# Patient Record
Sex: Male | Born: 1935 | Hispanic: No | Smoking: Former smoker
Health system: Southern US, Community
[De-identification: ages and names within clinical notes are randomized; demographics above are authoritative.]

---
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
## 2018-02-05 ENCOUNTER — Emergency Department (HOSPITAL_COMMUNITY): Payer: Medicare Other

## 2018-02-05 ENCOUNTER — Inpatient Hospital Stay (HOSPITAL_COMMUNITY)
Admission: EM | Admit: 2018-02-05 | Discharge: 2018-02-08 | DRG: 445 | Disposition: A | Discharge status: Home / Self Care | Payer: Medicare Other | Attending: Internal Medicine | Admitting: Internal Medicine

## 2018-02-05 ENCOUNTER — Other Ambulatory Visit: Payer: Self-pay

## 2018-02-05 ENCOUNTER — Inpatient Hospital Stay (HOSPITAL_COMMUNITY): Payer: Medicare Other

## 2018-02-05 ENCOUNTER — Encounter (HOSPITAL_COMMUNITY): Payer: Self-pay | Admitting: Emergency Medicine

## 2018-02-05 DIAGNOSIS — Z01818 Encounter for other preprocedural examination: Secondary | ICD-10-CM

## 2018-02-05 DIAGNOSIS — R748 Abnormal levels of other serum enzymes: Secondary | ICD-10-CM | POA: Diagnosis present

## 2018-02-05 DIAGNOSIS — E876 Hypokalemia: Secondary | ICD-10-CM | POA: Diagnosis present

## 2018-02-05 DIAGNOSIS — I440 Atrioventricular block, first degree: Secondary | ICD-10-CM | POA: Diagnosis present

## 2018-02-05 DIAGNOSIS — I493 Ventricular premature depolarization: Secondary | ICD-10-CM | POA: Diagnosis present

## 2018-02-05 DIAGNOSIS — R9431 Abnormal electrocardiogram [ECG] [EKG]: Secondary | ICD-10-CM | POA: Diagnosis present

## 2018-02-05 DIAGNOSIS — I5032 Chronic diastolic (congestive) heart failure: Secondary | ICD-10-CM | POA: Diagnosis present

## 2018-02-05 DIAGNOSIS — Z79899 Other long term (current) drug therapy: Secondary | ICD-10-CM | POA: Diagnosis not present

## 2018-02-05 DIAGNOSIS — K810 Acute cholecystitis: Secondary | ICD-10-CM | POA: Diagnosis present

## 2018-02-05 DIAGNOSIS — I351 Nonrheumatic aortic (valve) insufficiency: Secondary | ICD-10-CM | POA: Diagnosis not present

## 2018-02-05 DIAGNOSIS — K760 Fatty (change of) liver, not elsewhere classified: Secondary | ICD-10-CM | POA: Diagnosis present

## 2018-02-05 DIAGNOSIS — E871 Hypo-osmolality and hyponatremia: Secondary | ICD-10-CM | POA: Diagnosis present

## 2018-02-05 DIAGNOSIS — Z880 Allergy status to penicillin: Secondary | ICD-10-CM

## 2018-02-05 DIAGNOSIS — D72829 Elevated white blood cell count, unspecified: Secondary | ICD-10-CM | POA: Diagnosis not present

## 2018-02-05 LAB — COMPREHENSIVE METABOLIC PANEL
ALK PHOS: 63 U/L (ref 38–126)
ALT: 15 U/L — ABNORMAL LOW (ref 17–63)
ANION GAP: 11 (ref 5–15)
AST: 21 U/L (ref 15–41)
Albumin: 4.1 g/dL (ref 3.5–5.0)
BILIRUBIN TOTAL: 0.5 mg/dL (ref 0.3–1.2)
BUN: 17 mg/dL (ref 6–20)
CALCIUM: 8.7 mg/dL — AB (ref 8.9–10.3)
CO2: 21 mmol/L — ABNORMAL LOW (ref 22–32)
CREATININE: 1.04 mg/dL (ref 0.61–1.24)
Chloride: 103 mmol/L (ref 101–111)
GFR calc Af Amer: >60 mL/min (ref >60)
GFR calc non Af Amer: >60 mL/min (ref >60)
Glucose, Bld: 133 mg/dL — ABNORMAL HIGH (ref 65–99)
Potassium: 3.8 mmol/L (ref 3.5–5.1)
Sodium: 135 mmol/L (ref 135–145)
TOTAL PROTEIN: 6.8 g/dL (ref 6.5–8.1)

## 2018-02-05 LAB — TROPONIN I

## 2018-02-05 LAB — URINALYSIS, ROUTINE W REFLEX MICROSCOPIC
Bilirubin Urine: NEGATIVE
Glucose, UA: NEGATIVE mg/dL
Hgb urine dipstick: NEGATIVE
Ketones, ur: NEGATIVE mg/dL
LEUKOCYTES UA: NEGATIVE
Nitrite: NEGATIVE
PROTEIN: NEGATIVE mg/dL
Specific Gravity, Urine: 1.014 (ref 1.005–1.030)
pH: 7 (ref 5.0–8.0)

## 2018-02-05 LAB — CBC
HCT: 40.5 % (ref 39.0–52.0)
HEMOGLOBIN: 13.8 g/dL (ref 13.0–17.0)
MCH: 32.5 pg (ref 26.0–34.0)
MCHC: 34.1 g/dL (ref 30.0–36.0)
MCV: 95.5 fL (ref 78.0–100.0)
PLATELETS: 193 10*3/uL (ref 150–400)
RBC: 4.24 MIL/uL (ref 4.22–5.81)
RDW: 12.9 % (ref 11.5–15.5)
WBC: 8.7 10*3/uL (ref 4.0–10.5)

## 2018-02-05 LAB — LIPASE, BLOOD: Lipase: 55 U/L — ABNORMAL HIGH (ref 11–51)

## 2018-02-05 LAB — TSH: TSH: 0.667 u[IU]/mL (ref 0.350–4.500)

## 2018-02-05 MED ORDER — MORPHINE SULFATE (PF) 2 MG/ML IV SOLN: 2.0000 mg | INTRAVENOUS | Status: DC | PRN

## 2018-02-05 MED ORDER — TECHNETIUM TC 99M MEBROFENIN IV KIT
5.4500 | PACK | Freq: Once | INTRAVENOUS | Status: AC | PRN
  Administered 2018-02-05: 5.45 via INTRAVENOUS

## 2018-02-05 MED ORDER — IOPAMIDOL (ISOVUE-300) INJECTION 61%
100.0000 mL | Freq: Once | INTRAVENOUS | Status: AC | PRN
  Administered 2018-02-05: 100 mL via INTRAVENOUS

## 2018-02-05 MED ORDER — METRONIDAZOLE IN NACL 5-0.79 MG/ML-% IV SOLN
500.0000 mg | Freq: Three times a day (TID) | INTRAVENOUS | Status: DC
  Administered 2018-02-05 – 2018-02-07 (×5): 500 mg via INTRAVENOUS
  Filled 2018-02-05 (×6): qty 100

## 2018-02-05 MED ORDER — IOPAMIDOL (ISOVUE-300) INJECTION 61%
INTRAVENOUS | Status: AC
  Filled 2018-02-05: qty 100

## 2018-02-05 MED ORDER — MORPHINE SULFATE (PF) 2 MG/ML IV SOLN
2.0000 mg | Freq: Once | INTRAVENOUS | Status: AC
  Administered 2018-02-05: 2 mg via INTRAVENOUS
  Filled 2018-02-05: qty 1

## 2018-02-05 MED ORDER — IOPAMIDOL (ISOVUE-M 300) INJECTION 61%: 15.0000 mL | Freq: Once | INTRAMUSCULAR | Status: DC | PRN

## 2018-02-05 MED ORDER — ENOXAPARIN SODIUM 40 MG/0.4ML ~~LOC~~ SOLN
40.0000 mg | SUBCUTANEOUS | Status: DC
  Administered 2018-02-05 – 2018-02-07 (×3): 40 mg via SUBCUTANEOUS
  Filled 2018-02-05 (×3): qty 0.4

## 2018-02-05 MED ORDER — CIPROFLOXACIN IN D5W 400 MG/200ML IV SOLN
400.0000 mg | Freq: Two times a day (BID) | INTRAVENOUS | Status: DC
  Administered 2018-02-05 – 2018-02-07 (×4): 400 mg via INTRAVENOUS
  Filled 2018-02-05 (×4): qty 200

## 2018-02-05 MED ORDER — MORPHINE SULFATE (PF) 4 MG/ML IV SOLN
3.0000 mg | Freq: Once | INTRAVENOUS | Status: AC
  Administered 2018-02-05: 3 mg via INTRAVENOUS
  Filled 2018-02-05: qty 1

## 2018-02-05 MED ORDER — ONDANSETRON HCL 4 MG/2ML IJ SOLN
4.0000 mg | Freq: Once | INTRAMUSCULAR | Status: AC
Start: 2018-02-05 — End: 2018-02-05
  Administered 2018-02-05: 4 mg via INTRAVENOUS
  Filled 2018-02-05: qty 2

## 2018-02-05 MED ORDER — FAMOTIDINE 20 MG PO TABS
40.0000 mg | ORAL_TABLET | Freq: Once | ORAL | Status: AC
  Administered 2018-02-05: 40 mg via ORAL
  Filled 2018-02-05: qty 2

## 2018-02-05 MED ORDER — HYDROMORPHONE HCL 1 MG/ML IJ SOLN
0.5000 mg | Freq: Once | INTRAMUSCULAR | Status: AC
  Administered 2018-02-05: 0.5 mg via INTRAVENOUS
  Filled 2018-02-05: qty 1

## 2018-02-05 MED ORDER — SODIUM CHLORIDE 0.9 % IV SOLN
INTRAVENOUS | Status: DC
  Administered 2018-02-05: 08:00:00 via INTRAVENOUS

## 2018-02-05 MED ORDER — GI COCKTAIL ~~LOC~~
30.0000 mL | Freq: Once | ORAL | Status: AC
  Administered 2018-02-05: 30 mL via ORAL
  Filled 2018-02-05: qty 30

## 2018-02-05 MED ORDER — ONDANSETRON HCL 4 MG/2ML IJ SOLN: 4.0000 mg | Freq: Four times a day (QID) | INTRAMUSCULAR | Status: DC | PRN

## 2018-02-05 NOTE — Progress Notes (Signed)
Pharmacy consulted for Zosyn dosing for acute cholecystitis.  Patient has PCN allergy causing anaphylaxis.  Review of computer records revealed no history of antibiotics previously in our system.  Discussed with house coverage for hospitalist team.  Antibiotics changed to Cipro + Flagyl.    Netta Cedars, PharmD, BCPS 02/05/2018@8 :45 PM

## 2018-02-05 NOTE — ED Notes (Signed)
Radiology called requesting for an RN to come administer 3mg  morphine to pt in radiology for liver scan. Order placed, RN Rica Mote made aware.

## 2018-02-05 NOTE — ED Provider Notes (Signed)
[facility] DEPT Provider Note   CSN: [id number] Arrival date & time: 02/05/18  0530     History   Chief Complaint Chief Complaint  Patient presents with  . Abdominal Pain    HPI Jeremy Marks is a 84 y.o. male.  84 year old male presents with acute onset of epigastric abdominal pain that is nonradiating characterizes pressure.  States he had a heavy meal with wine last night and thinks that this could be the cause.  He has had nausea but no vomiting.  No fever or chills.  No urinary symptoms.  Denies any anorexia.  No chest pain or shortness of breath.  No exertional component.  No history of same.  Used Pepto-Bismol without relief.     History reviewed. No pertinent past medical history.  There are no active problems to display for this patient.   History reviewed. No pertinent surgical history.      Home Medications    Prior to Admission medications   Medication Sig Start Date End Date Taking? Authorizing Provider  acidophilus (RISAQUAD) CAPS capsule Take 1 capsule by mouth daily.   Yes [provider]  bisacodyl (DULCOLAX) 5 MG EC tablet Take 5 mg by mouth daily as needed for moderate constipation.   Yes [provider]  bismuth subsalicylate (PEPTO BISMOL) 262 MG/15ML suspension Take 30 mLs by mouth every 6 (six) hours as needed for indigestion.   Yes [provider]  dorzolamide-timolol (COSOPT) 22.3-6.8 MG/ML ophthalmic solution Place 1 drop into both eyes 2 (two) times daily.   Yes [provider]  latanoprost (XALATAN) 0.005 % ophthalmic solution Place 1 drop into both eyes at bedtime.   Yes [provider]    Family History No family history on file.  Social History Social History   Tobacco Use  . Smoking status: Not on file  Substance Use Topics  . Alcohol use: Not on file  . Drug use: Not on file     Allergies   Penicillins   Review of Systems Review of  Systems  All other systems reviewed and are negative.    Physical Exam Updated Vital Signs BP (!) 186/89 (BP Location: Left Arm)   Pulse (!) 55   Temp 97.6 F (36.4 C) (Oral)   Resp 15   SpO2 100%   Physical Exam  Constitutional: He is oriented to person, place, and time. He appears well-developed and well-nourished.  Non-toxic appearance. No distress.  HENT:  Head: Normocephalic and atraumatic.  Eyes: Pupils are equal, round, and reactive to light. Conjunctivae, EOM and lids are normal.  Neck: Normal range of motion. Neck supple. No tracheal deviation present. No thyroid mass present.  Cardiovascular: Normal rate, regular rhythm and normal heart sounds. Exam reveals no gallop.  No murmur heard. Pulmonary/Chest: Effort normal and breath sounds normal. No stridor. No respiratory distress. He has no decreased breath sounds. He has no wheezes. He has no rhonchi. He has no rales.  Abdominal: Soft. Normal appearance and bowel sounds are normal. He exhibits no distension. There is tenderness in the epigastric area. There is no rigidity, no rebound, no guarding and no CVA tenderness.    Musculoskeletal: Normal range of motion. He exhibits no edema or tenderness.  Neurological: He is alert and oriented to person, place, and time. He has normal strength. No cranial nerve deficit or sensory deficit. GCS eye subscore is 4. GCS verbal subscore is 5. GCS motor subscore is 6.  Skin: Skin is  warm and dry. No abrasion and no rash noted.  Psychiatric: He has a normal mood and affect. His speech is normal and behavior is normal.  Nursing note and vitals reviewed.    ED Treatments / Results  Labs (all labs ordered are listed, but only abnormal results are displayed) Labs Reviewed  LIPASE, BLOOD  COMPREHENSIVE METABOLIC PANEL  CBC  URINALYSIS, ROUTINE W REFLEX MICROSCOPIC    EKG EKG Interpretation  Date/Time:  Friday Feb 05 2018 06:22:51 EDT Ventricular Rate:  56 PR Interval:    QRS  Duration: 105 QT Interval:  435 QTC Calculation: 420 R Axis:   -14 Text Interpretation:  Sinus rhythm Ventricular premature complex Prolonged PR interval Anterior infarct, old No previous ECGs available Abnormal ekg Confirmed by Ripley Fraise [phone]) on 02/05/2018 6:29:08 AM   Radiology No results found.  Procedures Procedures (including critical care time)  Medications Ordered in ED Medications  0.9 %  sodium chloride infusion (has no administration in time range)  ondansetron (ZOFRAN) injection 4 mg (has no administration in time range)  morphine 2 MG/ML injection 2 mg (has no administration in time range)     Initial Impression / Assessment and Plan / ED Course  I have reviewed the triage vital signs and the nursing notes.  Pertinent labs & imaging results that were available during my care of the patient were reviewed by me and considered in my medical decision making (see chart for details).     Recent CT scan was concerning for possible cholelithiasis versus cholecystitis.  Had abdominal ultrasound was recommended a HIDA scan due to gallbladder sludge.  Patient had a scan is pending at this time will be signed out to Dr. Dayna Barker  Final Clinical Impressions(s) / ED Diagnoses   Final diagnoses:  None    ED Discharge Orders    None       Lacretia Leigh, MD 02/05/18 1554

## 2018-02-05 NOTE — ED Notes (Signed)
Patient informed about HIDA scan at 5-5:30.

## 2018-02-05 NOTE — ED Triage Notes (Signed)
Pt from home with c/o abdominal pain that woke him from his sleep. Pt rates pain 5/10. Pt states he took pepto bismol PTA. Pt states "I feel like I will burp and feel better".

## 2018-02-05 NOTE — Consult Note (Signed)
Reason for Consult:cholecystitis Referring Physician: Dr. Merrily Pew  Jeremy Marks is an 84 y.o. male.  HPI: This is a pleasant 84 year old gentleman who presented to the emergency department about 3 AM this morning with abdominal pain.  He described moderate epigastric abdominal pain with nausea but no vomiting.  The pain was described as sharp.  He initially had a CAT scan of the abdomen and pelvis which showed an area of hazy inflammation near the gallbladder.  This prompted an ultrasound of the abdomen that showed sludge in the gallbladder but no stones, wall thickening, or pericholecystic fluid.  He then underwent a HIDA scan showing nonvisualization of the gallbladder consistent with acute cholecystitis.  He reports that his abdominal pain is now better but still present in the right upper quadrant.  Surprisingly, he has very little medical history.  History reviewed. No pertinent past medical history.  History reviewed. No pertinent surgical history.  No family history on file.  Social History:  has no tobacco, alcohol, and drug history on file.  Allergies:  Allergies  Allergen Reactions  . Penicillins Anaphylaxis    Medications: I have reviewed the patient's current medications.  Results for orders placed or performed during the hospital encounter of 02/05/18 (from the past 48 hour(s))  Urinalysis, Routine w reflex microscopic     Status: None   Collection Time: 02/05/18  7:32 AM  Result Value Ref Range   Color, Urine YELLOW YELLOW   APPearance CLEAR CLEAR   Specific Gravity, Urine 1.014 1.005 - 1.030   pH 7.0 5.0 - 8.0   Glucose, UA NEGATIVE NEGATIVE mg/dL   Hgb urine dipstick NEGATIVE NEGATIVE   Bilirubin Urine NEGATIVE NEGATIVE   Ketones, ur NEGATIVE NEGATIVE mg/dL   Protein, ur NEGATIVE NEGATIVE mg/dL   Nitrite NEGATIVE NEGATIVE   Leukocytes, UA NEGATIVE NEGATIVE    Comment: Performed at [facility] [address] [place], [place]  [address]  Lipase, blood     Status: Abnormal   Collection Time: 02/05/18  7:41 AM  Result Value Ref Range   Lipase 55 (H) 11 - 51 U/L    Comment: Performed at [facility], [place] [address], [place], [place] [address]  Comprehensive metabolic panel     Status: Abnormal   Collection Time: 02/05/18  7:41 AM  Result Value Ref Range   Sodium 135 135 - 145 mmol/L   Potassium 3.8 3.5 - 5.1 mmol/L   Chloride 103 101 - 111 mmol/L   CO2 21 (L) 22 - 32 mmol/L   Glucose, Bld 133 (H) 65 - 99 mg/dL   BUN 17 6 - 20 mg/dL   Creatinine, Ser 1.04 0.61 - 1.24 mg/dL   Calcium 8.7 (L) 8.9 - 10.3 mg/dL   Total Protein 6.8 6.5 - 8.1 g/dL   Albumin 4.1 3.5 - 5.0 g/dL   AST 21 15 - 41 U/L   ALT 15 (L) 17 - 63 U/L   Alkaline Phosphatase 63 38 - 126 U/L   Total Bilirubin 0.5 0.3 - 1.2 mg/dL   GFR calc non Af Amer >60 >60 mL/min   GFR calc Af Amer >60 >60 mL/min    Comment: (NOTE) The eGFR has been calculated using the CKD EPI equation. This calculation has not been validated in all clinical situations. eGFR's persistently <60 mL/min signify possible Chronic Kidney Disease.    Anion gap 11 5 - 15    Comment: Performed at [facility], [place]  [address], [place], [place] [address]  CBC     Status: None   Collection Time: 02/05/18  7:41 AM  Result Value Ref Range   WBC 8.7 4.0 - 10.5 K/uL   RBC 4.24 4.22 - 5.81 MIL/uL   Hemoglobin 13.8 13.0 - 17.0 g/dL   HCT 40.5 39.0 - 52.0 %   MCV 95.5 78.0 - 100.0 fL   MCH 32.5 26.0 - 34.0 pg   MCHC 34.1 30.0 - 36.0 g/dL   RDW 12.9 11.5 - 15.5 %   Platelets 193 150 - 400 K/uL    Comment: Performed at [facility], [place] [address] [place], [place] [address]  Troponin I     Status: None   Collection Time: 02/05/18  7:41 AM  Result Value Ref Range   Troponin I <0.03 <0.03 ng/mL    Comment: Performed at [facility], [place] [address], [place], [place] [address]    Nm Hepatobiliary Liver  Func  Result Date: 02/05/2018 CLINICAL DATA:  Abdominal pain.  Evaluate for acute cholecystitis. EXAM: NUCLEAR MEDICINE HEPATOBILIARY IMAGING TECHNIQUE: Sequential images of the abdomen were obtained out to 60 minutes following intravenous administration of radiopharmaceutical. RADIOPHARMACEUTICALS:  5.45 mCi Tc-46m Choletec IV COMPARISON:  CT abdomen and pelvis and right upper quadrant ultrasound from same day. FINDINGS: Prompt uptake and biliary excretion of activity by the liver is seen. Gallbladder activity is not visualized. Biliary activity passes into small bowel, consistent with patent common bile duct. IMPRESSION: 1. Nonvisualization of the gallbladder, consistent with acute cholecystitis. Electronically Signed   By: WTitus DubinM.D.   On: 02/05/2018 17:14   Ct Abdomen Pelvis W Contrast  Result Date: 02/05/2018 CLINICAL DATA:  Upper to mid abdominal pain beginning this morning. Abdominal distention. EXAM: CT ABDOMEN AND PELVIS WITH CONTRAST TECHNIQUE: Multidetector CT imaging of the abdomen and pelvis was performed using the standard protocol following bolus administration of intravenous contrast. CONTRAST:  1046mISOVUE-300 IOPAMIDOL (ISOVUE-300) INJECTION 61% COMPARISON:  None. FINDINGS: Lower chest: No acute abnormality. Hepatobiliary: Liver is unremarkable. Gallbladder is distended. Although the wall does appear thickened, there is mild hazy inflammation in the fat adjacent to the lower gallbladder and gallbladder neck. No discrete stone is seen. No bile duct dilation. Pancreas: Unremarkable. No pancreatic ductal dilatation or surrounding inflammatory changes. Spleen: Normal in size without focal abnormality. Adrenals/Urinary Tract: Kidneys are normal in position. Symmetric renal enhancement and excretion. Small cyst arises from the lower pole the right kidney. No other masses. No stones. No hydronephrosis. Normal ureters. Bladder is unremarkable. Stomach/Bowel: Stomach and small bowel are  unremarkable. There are diverticula scattered throughout the colon. No diverticulitis or other colonic abnormality. Normal appendix visualized. Vascular/Lymphatic: Dense aortic atherosclerotic calcifications. No aneurysm. No enlarged lymph nodes. Reproductive: Marked enlargement of the prostate gland, measuring 9 x 6.1 x 6.4 cm. Other: No abdominal wall hernia or abnormality. No abdominopelvic ascites. Musculoskeletal: Mild compression fracture of L1, most likely chronic. No other fractures. No osteoblastic or osteolytic lesions. IMPRESSION: 1. Mild hazy inflammation adjacent to the gallbladder. Suspect early acute cholecystitis if this correlates clinically. Consider follow-up right upper quadrant ultrasound to assess for gallstones not visualized by CT. 2. No other evidence of an acute abnormality. 3. Marked prostatic hypertrophy. 4. Aortic atherosclerosis. 5. Scattered colonic diverticula without diverticulitis. Electronically Signed   By: DaLajean Manes.D.   On: 02/05/2018 09:27   UsKoreabdomen Limited  Result Date: 02/05/2018 CLINICAL DATA:  Right upper quadrant pain. EXAM: ULTRASOUND ABDOMEN  LIMITED RIGHT UPPER QUADRANT COMPARISON:  CT scan Feb 05, 2017 FINDINGS: Gallbladder: There is sludge in the gallbladder with no stones, wall thickening, pericholecystic fluid, or Murphy's sign. Common bile duct: Diameter: 3.6 mm Liver: Hepatic steatosis. No focal mass. Portal vein is patent on color Doppler imaging with normal direction of blood flow towards the liver. IMPRESSION: 1. There is sludge in the gallbladder with no wall thickening, pericholecystic fluid, stones, or Murphy's sign. A HIDA scan could further evaluate if clinical concern persists. 2. Hepatic steatosis. Electronically Signed   By: Dorise Bullion III M.D   On: 02/05/2018 10:51    Review of Systems  Constitutional: Negative for chills and fever.  Respiratory: Negative for cough and shortness of breath.   Cardiovascular: Negative for chest  pain.  Gastrointestinal: Positive for abdominal pain and nausea. Negative for vomiting.  Genitourinary: Negative for dysuria.  All other systems reviewed and are negative.  Blood pressure (!) 133/58, pulse 62, temperature 97.6 F (36.4 C), temperature source Oral, resp. rate 18, SpO2 98 %. Physical Exam  Constitutional: He is oriented to person, place, and time. He appears well-developed and well-nourished. No distress.  HENT:  Head: Normocephalic and atraumatic.  Right Ear: External ear normal.  Left Ear: External ear normal.  Nose: Nose normal.  Mouth/Throat: No oropharyngeal exudate.  Eyes: Pupils are equal, round, and reactive to light. Conjunctivae are normal. Right eye exhibits no discharge. Left eye exhibits no discharge. No scleral icterus.  Neck: Normal range of motion. No tracheal deviation present.  Cardiovascular: Normal rate, regular rhythm and normal heart sounds.  No murmur heard. Respiratory: Effort normal and breath sounds normal. No respiratory distress. He has no wheezes.  GI: Soft. He exhibits no distension. There is tenderness. There is guarding.  His abdomen is soft.  There is some tenderness with guarding in the right upper quadrant  Musculoskeletal: Normal range of motion. He exhibits no edema.  Neurological: He is alert and oriented to person, place, and time.  Skin: Skin is warm. He is not diaphoretic. No erythema.  Psychiatric: His behavior is normal. Judgment normal.    Assessment/Plan: Acute acalculous cholecystitis  I discussed the diagnosis with the patient.  Normally, we would recommend him proceeding with a laparoscopic cholecystectomy.  Given his age of 65, he does need a medical preoperative evaluation just to assure he is a candidate for general anesthesia from a cardiopulmonary standpoint.  I believe he can currently have liquids.  He will be started on IV antibiotics.  If he does quickly improve and become asymptomatic, we could consider continued  conservative management with oral antibiotics and hold on cholecystectomy.  We will make this decision based on his clinical picture.  Tacoma Merida A 02/05/2018, 7:01 PM

## 2018-02-05 NOTE — H&P (Addendum)
History and Physical  Charlton Boule Bolio UJW:[id number] DOB: 22-Jun-1923 DOA: 02/05/2018  Referring physician: Dr Dolly Rias  PCP: Orpah Melter, MD  Outpatient Specialists: [facility] Patient coming from: Home  Chief Complaint: Abdominal pain  HPI: Jeremy Marks is a 84 y.o. male with no significant past medical history who presented to [facility] ED with complaints of right upper quadrant and epigastric pain.  Onset early this morning associated with one loose stool.  Denies nausea or vomiting, fevers or chills.  States pain was severe, nonradiating.  He has never experienced any like that in the past.  No previous abdominal surgeries.  Reports occasional wine consumption.  No history of tobacco or illicit drug use.  No family history of sudden cardiac death.  Not on any prescribed medications and follows up with his PCP once a year.  ED Course: Upon presentation to the ED, vital signs are stable.  Afebrile.  Lab studies unremarkable.  EKG personally reviewed revealed first-degree AV block, sinus rhythm with rate of 56 and occasional PVCs.  CT abdomen and pelvis with contrast revealed suspicion for acute cholecystitis Confirmed by HIDA scan.  Abdominal ultrasound revealed hepatic steatosis.  General surgery consulted by ED physician.  Admitted for acute cholecystitis.  Review of Systems: Review of systems as noted in HPI.  All other systems reviewed and are negative.   History reviewed. No pertinent past medical history. History reviewed. No pertinent surgical history.  Social History:  has no tobacco, alcohol, and drug history on file. Denies use of tobacco or illicit drug.  Occasional wine use.  Allergies  Allergen Reactions  . Penicillins Anaphylaxis    No family history on file.  Mother with history of A. fib.  Deceased. No family history of a sudden cardiac death.  Prior to Admission medications   Medication Sig Start Date End Date Taking? Authorizing Provider    acidophilus (RISAQUAD) CAPS capsule Take 1 capsule by mouth daily.   Yes [provider]  bisacodyl (DULCOLAX) 5 MG EC tablet Take 5 mg by mouth daily as needed for moderate constipation.   Yes [provider]  bismuth subsalicylate (PEPTO BISMOL) 262 MG/15ML suspension Take 30 mLs by mouth every 6 (six) hours as needed for indigestion.   Yes [provider]  dorzolamide-timolol (COSOPT) 22.3-6.8 MG/ML ophthalmic solution Place 1 drop into both eyes 2 (two) times daily.   Yes [provider]  latanoprost (XALATAN) 0.005 % ophthalmic solution Place 1 drop into both eyes at bedtime.   Yes [provider]    Physical Exam: BP (!) 148/69 (BP Location: Right Arm)   Pulse (!) 59   Temp 97.7 F (36.5 C) (Oral)   Resp 17   SpO2 100%   . General: 84 y.o. year-old male well developed well nourished in no acute distress.  Alert and oriented x3. . Cardiovascular: Regular rate and rhythm with no rubs or gallops.  No thyromegaly or JVD noted.   Marland Kitchen Respiratory: Clear to auscultation with no wheezes or rales. Good inspiratory effort. . Abdomen: Soft nontender nondistended with normal bowel sounds x4 quadrants. . Musculoskeletal: No lower extremity edema. 2/4 pulses in all 4 extremities. . Skin: No ulcerative lesions noted or rashes, . Psychiatry: Mood is appropriate for condition and setting          Labs on Admission:  Basic Metabolic Panel: Recent Labs  Lab 02/05/18 0741  NA 135  K 3.8  CL 103  CO2 21*  GLUCOSE 133*  BUN  17  CREATININE 1.04  CALCIUM 8.7*   Liver Function Tests: Recent Labs  Lab 02/05/18 0741  AST 21  ALT 15*  ALKPHOS 63  BILITOT 0.5  PROT 6.8  ALBUMIN 4.1   Recent Labs  Lab 02/05/18 0741  LIPASE 55*   No results for input(s): AMMONIA in the last 168 hours. CBC: Recent Labs  Lab 02/05/18 0741  WBC 8.7  HGB 13.8  HCT 40.5  MCV 95.5  PLT 193   Cardiac Enzymes: Recent Labs  Lab 02/05/18 0741  TROPONINI  <0.03    BNP (last 3 results) No results for input(s): BNP in the last 8760 hours.  ProBNP (last 3 results) No results for input(s): PROBNP in the last 8760 hours.  CBG: No results for input(s): GLUCAP in the last 168 hours.  Radiological Exams on Admission: Nm Hepatobiliary Liver Func  Result Date: 02/05/2018 CLINICAL DATA:  Abdominal pain.  Evaluate for acute cholecystitis. EXAM: NUCLEAR MEDICINE HEPATOBILIARY IMAGING TECHNIQUE: Sequential images of the abdomen were obtained out to 60 minutes following intravenous administration of radiopharmaceutical. RADIOPHARMACEUTICALS:  5.45 mCi Tc-74m  Choletec IV COMPARISON:  CT abdomen and pelvis and right upper quadrant ultrasound from same day. FINDINGS: Prompt uptake and biliary excretion of activity by the liver is seen. Gallbladder activity is not visualized. Biliary activity passes into small bowel, consistent with patent common bile duct. IMPRESSION: 1. Nonvisualization of the gallbladder, consistent with acute cholecystitis. Electronically Signed   By: Titus Dubin M.D.   On: 02/05/2018 17:14   Ct Abdomen Pelvis W Contrast  Result Date: 02/05/2018 CLINICAL DATA:  Upper to mid abdominal pain beginning this morning. Abdominal distention. EXAM: CT ABDOMEN AND PELVIS WITH CONTRAST TECHNIQUE: Multidetector CT imaging of the abdomen and pelvis was performed using the standard protocol following bolus administration of intravenous contrast. CONTRAST:  189mL ISOVUE-300 IOPAMIDOL (ISOVUE-300) INJECTION 61% COMPARISON:  None. FINDINGS: Lower chest: No acute abnormality. Hepatobiliary: Liver is unremarkable. Gallbladder is distended. Although the wall does appear thickened, there is mild hazy inflammation in the fat adjacent to the lower gallbladder and gallbladder neck. No discrete stone is seen. No bile duct dilation. Pancreas: Unremarkable. No pancreatic ductal dilatation or surrounding inflammatory changes. Spleen: Normal in size without focal  abnormality. Adrenals/Urinary Tract: Kidneys are normal in position. Symmetric renal enhancement and excretion. Small cyst arises from the lower pole the right kidney. No other masses. No stones. No hydronephrosis. Normal ureters. Bladder is unremarkable. Stomach/Bowel: Stomach and small bowel are unremarkable. There are diverticula scattered throughout the colon. No diverticulitis or other colonic abnormality. Normal appendix visualized. Vascular/Lymphatic: Dense aortic atherosclerotic calcifications. No aneurysm. No enlarged lymph nodes. Reproductive: Marked enlargement of the prostate gland, measuring 9 x 6.1 x 6.4 cm. Other: No abdominal wall hernia or abnormality. No abdominopelvic ascites. Musculoskeletal: Mild compression fracture of L1, most likely chronic. No other fractures. No osteoblastic or osteolytic lesions. IMPRESSION: 1. Mild hazy inflammation adjacent to the gallbladder. Suspect early acute cholecystitis if this correlates clinically. Consider follow-up right upper quadrant ultrasound to assess for gallstones not visualized by CT. 2. No other evidence of an acute abnormality. 3. Marked prostatic hypertrophy. 4. Aortic atherosclerosis. 5. Scattered colonic diverticula without diverticulitis. Electronically Signed   By: Lajean Manes M.D.   On: 02/05/2018 09:27   US Abdomen Limited  Result Date: 02/05/2018 CLINICAL DATA:  Right upper quadrant pain. EXAM: ULTRASOUND ABDOMEN LIMITED RIGHT UPPER QUADRANT COMPARISON:  CT scan Feb 05, 2017 FINDINGS: Gallbladder: There is sludge in the gallbladder with no  stones, wall thickening, pericholecystic fluid, or Murphy's sign. Common bile duct: Diameter: 3.6 mm Liver: Hepatic steatosis. No focal mass. Portal vein is patent on color Doppler imaging with normal direction of blood flow towards the liver. IMPRESSION: 1. There is sludge in the gallbladder with no wall thickening, pericholecystic fluid, stones, or Murphy's sign. A HIDA scan could further evaluate  if clinical concern persists. 2. Hepatic steatosis. Electronically Signed   By: Dorise Bullion III M.D   On: 02/05/2018 10:51    EKG: Independently reviewed.  Personally reviewed EKG which revealed first-degree AV block, sinus rhythm with rate of 56 and occasional PVCs.    Assessment/Plan Present on Admission: . Acute cholecystitis  Active Problems:   Acute cholecystitis  Acute cholecystitis CT abdomen and pelvis done on presentation revealed suspicion for acute cholecystitis confirmed by HIDA scan Pain management in place with IV morphine 2 mg every 4 hours as needed for moderate to severe pain Can have clear liquid diet as tolerated per surgery N.p.o. after midnight IV Zofran for nausea as needed Gentle IV hydration 75 cc/hr NS IV zosyn empirically General surgery Dr. Ninfa Linden has been consulted.  Highly appreciated.  Abnormal EKG/first-degree AV block/occasional PVCs No prior cardiac history We will obtain a 2D echo to assess for any structural abnormality/aortic stenosis Monitor on telemetry  Hepatic steatosis LFTs are normal Repeat CMP in the morning  Elevated lipase We will obtain lipid panel Lipase 57 Repeat lipase level in the morning  Preop clearance We will obtain a chest x-ray Obtain lipid panel Obtain 2D echo to assess for any structural abnormalities/aortic stenosis.  Risks: Moderate due to age and abnormal EKG.  DVT prophylaxis: Subcu Lovenox daily  Code Status: Full code  Family Communication: None at bedside  Disposition Plan: Admitted as inpatient status  Consults called: General surgery called by ED physician.  Admission status: Inpatient    Kayleen Memos MD [facility] Pager [phone]  If 7PM-7AM, please contact night-coverage [email or web address] Password [facility]  02/05/2018, 7:38 PM

## 2018-02-05 NOTE — ED Notes (Signed)
ED TO INPATIENT HANDOFF REPORT  Name/Age/Gender Jeremy Marks 84 y.o. male  Code Status    Code Status Orders  (From admission, onward)        Start     Ordered   02/05/18 1848  Full code  Continuous     02/05/18 1847    Code Status History    This patient has a current code status but no historical code status.    Advance Directive Documentation     Most Recent Value  Type of Advance Directive  Healthcare Power of Attorney, Living will  Pre-existing out of facility DNR order (yellow form or pink MOST form)  -  "MOST" Form in Place?  -      Home/SNF/Other Home  Chief Complaint abd pain   Level of Care/Admitting Diagnosis ED Disposition    ED Disposition Condition [place]: [place] [[id number]]  Level of Care: Med-Surg [16]  Diagnosis: Acute cholecystitis [575.0.ICD-9-CM]  Admitting Physician: Kayleen Memos [[id number]]  Attending Physician: Kayleen Memos [[id number]]  Estimated length of stay: past midnight tomorrow  Certification:: I certify this patient will need inpatient services for at least 2 midnights  PT Class (Do Not Modify): Inpatient [101]  PT Acc Code (Do Not Modify): Private [1]       Medical History History reviewed. No pertinent past medical history.  Allergies Allergies  Allergen Reactions  . Penicillins Anaphylaxis    IV Location/Drains/Wounds Patient Lines/Drains/Airways Status   Active Line/Drains/Airways    Name:   Placement date:   Placement time:   Site:   Days:   Peripheral IV 02/05/18 Left Antecubital   02/05/18    0741    Antecubital   less than 1          Labs/Imaging Results for orders placed or performed during the hospital encounter of 02/05/18 (from the past 48 hour(s))  Urinalysis, Routine w reflex microscopic     Status: None   Collection Time: 02/05/18  7:32 AM  Result Value Ref Range   Color, Urine YELLOW YELLOW   APPearance CLEAR CLEAR   Specific Gravity,  Urine 1.014 1.005 - 1.030   pH 7.0 5.0 - 8.0   Glucose, UA NEGATIVE NEGATIVE mg/dL   Hgb urine dipstick NEGATIVE NEGATIVE   Bilirubin Urine NEGATIVE NEGATIVE   Ketones, ur NEGATIVE NEGATIVE mg/dL   Protein, ur NEGATIVE NEGATIVE mg/dL   Nitrite NEGATIVE NEGATIVE   Leukocytes, UA NEGATIVE NEGATIVE    Comment: Performed at [facility], [place] [address], [place], [place] [address]  Lipase, blood     Status: Abnormal   Collection Time: 02/05/18  7:41 AM  Result Value Ref Range   Lipase 55 (H) 11 - 51 U/L    Comment: Performed at [facility], [place] [address] [place], [place] [address]  Comprehensive metabolic panel     Status: Abnormal   Collection Time: 02/05/18  7:41 AM  Result Value Ref Range   Sodium 135 135 - 145 mmol/L   Potassium 3.8 3.5 - 5.1 mmol/L   Chloride 103 101 - 111 mmol/L   CO2 21 (L) 22 - 32 mmol/L   Glucose, Bld 133 (H) 65 - 99 mg/dL   BUN 17 6 - 20 mg/dL   Creatinine, Ser 1.04 0.61 - 1.24 mg/dL   Calcium 8.7 (L) 8.9 - 10.3 mg/dL   Total Protein 6.8 6.5 - 8.1 g/dL   Albumin 4.1 3.5 -  5.0 g/dL   AST 21 15 - 41 U/L   ALT 15 (L) 17 - 63 U/L   Alkaline Phosphatase 63 38 - 126 U/L   Total Bilirubin 0.5 0.3 - 1.2 mg/dL   GFR calc non Af Amer >60 >60 mL/min   GFR calc Af Amer >60 >60 mL/min    Comment: (NOTE) The eGFR has been calculated using the CKD EPI equation. This calculation has not been validated in all clinical situations. eGFR's persistently <60 mL/min signify possible Chronic Kidney Disease.    Anion gap 11 5 - 15    Comment: Performed at [facility], [place] [address], [place], [place] [address]  CBC     Status: None   Collection Time: 02/05/18  7:41 AM  Result Value Ref Range   WBC 8.7 4.0 - 10.5 K/uL   RBC 4.24 4.22 - 5.81 MIL/uL   Hemoglobin 13.8 13.0 - 17.0 g/dL   HCT 40.5 39.0 - 52.0 %   MCV 95.5 78.0 - 100.0 fL   MCH 32.5 26.0 - 34.0 pg   MCHC 34.1 30.0 - 36.0 g/dL   RDW 12.9 11.5 - 15.5  %   Platelets 193 150 - 400 K/uL    Comment: Performed at [facility], [place] [address], [place], [place] [address]  Troponin I     Status: None   Collection Time: 02/05/18  7:41 AM  Result Value Ref Range   Troponin I <0.03 <0.03 ng/mL    Comment: Performed at [facility], [place] [address], [place], [place] [address]   Nm Hepatobiliary Liver Func  Result Date: 02/05/2018 CLINICAL DATA:  Abdominal pain.  Evaluate for acute cholecystitis. EXAM: NUCLEAR MEDICINE HEPATOBILIARY IMAGING TECHNIQUE: Sequential images of the abdomen were obtained out to 60 minutes following intravenous administration of radiopharmaceutical. RADIOPHARMACEUTICALS:  5.45 mCi Tc-9m Choletec IV COMPARISON:  CT abdomen and pelvis and right upper quadrant ultrasound from same day. FINDINGS: Prompt uptake and biliary excretion of activity by the liver is seen. Gallbladder activity is not visualized. Biliary activity passes into small bowel, consistent with patent common bile duct. IMPRESSION: 1. Nonvisualization of the gallbladder, consistent with acute cholecystitis. Electronically Signed   By: WTitus DubinM.D.   On: 02/05/2018 17:14   Ct Abdomen Pelvis W Contrast  Result Date: 02/05/2018 CLINICAL DATA:  Upper to mid abdominal pain beginning this morning. Abdominal distention. EXAM: CT ABDOMEN AND PELVIS WITH CONTRAST TECHNIQUE: Multidetector CT imaging of the abdomen and pelvis was performed using the standard protocol following bolus administration of intravenous contrast. CONTRAST:  1063mISOVUE-300 IOPAMIDOL (ISOVUE-300) INJECTION 61% COMPARISON:  None. FINDINGS: Lower chest: No acute abnormality. Hepatobiliary: Liver is unremarkable. Gallbladder is distended. Although the wall does appear thickened, there is mild hazy inflammation in the fat adjacent to the lower gallbladder and gallbladder neck. No discrete stone is seen. No bile duct dilation. Pancreas: Unremarkable. No pancreatic  ductal dilatation or surrounding inflammatory changes. Spleen: Normal in size without focal abnormality. Adrenals/Urinary Tract: Kidneys are normal in position. Symmetric renal enhancement and excretion. Small cyst arises from the lower pole the right kidney. No other masses. No stones. No hydronephrosis. Normal ureters. Bladder is unremarkable. Stomach/Bowel: Stomach and small bowel are unremarkable. There are diverticula scattered throughout the colon. No diverticulitis or other colonic abnormality. Normal appendix visualized. Vascular/Lymphatic: Dense aortic atherosclerotic calcifications. No aneurysm. No enlarged lymph nodes. Reproductive: Marked enlargement of the prostate gland, measuring 9 x 6.1 x 6.4 cm. Other: No  abdominal wall hernia or abnormality. No abdominopelvic ascites. Musculoskeletal: Mild compression fracture of L1, most likely chronic. No other fractures. No osteoblastic or osteolytic lesions. IMPRESSION: 1. Mild hazy inflammation adjacent to the gallbladder. Suspect early acute cholecystitis if this correlates clinically. Consider follow-up right upper quadrant ultrasound to assess for gallstones not visualized by CT. 2. No other evidence of an acute abnormality. 3. Marked prostatic hypertrophy. 4. Aortic atherosclerosis. 5. Scattered colonic diverticula without diverticulitis. Electronically Signed   By: Lajean Manes M.D.   On: 02/05/2018 09:27   US Abdomen Limited  Result Date: 02/05/2018 CLINICAL DATA:  Right upper quadrant pain. EXAM: ULTRASOUND ABDOMEN LIMITED RIGHT UPPER QUADRANT COMPARISON:  CT scan Feb 05, 2017 FINDINGS: Gallbladder: There is sludge in the gallbladder with no stones, wall thickening, pericholecystic fluid, or Murphy's sign. Common bile duct: Diameter: 3.6 mm Liver: Hepatic steatosis. No focal mass. Portal vein is patent on color Doppler imaging with normal direction of blood flow towards the liver. IMPRESSION: 1. There is sludge in the gallbladder with no wall  thickening, pericholecystic fluid, stones, or Murphy's sign. A HIDA scan could further evaluate if clinical concern persists. 2. Hepatic steatosis. Electronically Signed   By: Dorise Bullion III M.D   On: 02/05/2018 10:51    Pending Labs Unresulted Labs (From admission, onward)   Start     Ordered   02/05/18 1847  Lipid panel  Add-on,   R     02/05/18 1846      Vitals/Pain Today's Vitals   02/05/18 0544 02/05/18 1142 02/05/18 1413 02/05/18 1834  BP:  120/84 (!) 131/51 (!) 133/58  Pulse:  89 61 62  Resp:  _0 Temp:      TempSrc:      SpO2:  93% 97% 98%  PainSc: 5        Isolation Precautions No active isolations  Medications Medications  0.9 %  sodium chloride infusion ( Intravenous New Bag/Given 02/05/18 0747)  iopamidol (ISOVUE-M) 61 % intrathecal injection 15 mL (has no administration in time range)  iopamidol (ISOVUE-300) 61 % injection (has no administration in time range)  morphine 2 MG/ML injection 2 mg (has no administration in time range)  ondansetron (ZOFRAN) injection 4 mg (4 mg Intravenous Given 02/05/18 0747)  morphine 2 MG/ML injection 2 mg (2 mg Intravenous Given 02/05/18 0747)  gi cocktail (Maalox,Lidocaine,Donnatal) (30 mLs Oral Given 02/05/18 0746)  famotidine (PEPCID) tablet 40 mg (40 mg Oral Given 02/05/18 0746)  iopamidol (ISOVUE-300) 61 % injection 100 mL (100 mLs Intravenous Contrast Given 02/05/18 0901)  HYDROmorphone (DILAUDID) injection 0.5 mg (0.5 mg Intravenous Given 02/05/18 1108)  technetium TC 20M mebrofenin (CHOLETEC) injection 3.82 millicurie (5.05 millicuries Intravenous Contrast Given 02/05/18 1505)  morphine 4 MG/ML injection 3 mg (3 mg Intravenous Given 02/05/18 1624)    Mobility walks with person assist

## 2018-02-05 NOTE — ED Provider Notes (Signed)
4:45 PM Assumed care from Dr. Zenia Resides, please see their note for full history, physical and decision making until this point. In brief this is a 84 y.o. year old male who presented to the ED tonight with Abdominal Pain     Suspect GB issue, pending HIDA.   HIDA positive for cholecystitis. Discussed with Dr. Rush Farmer with Gen Surg who requests medicine admission 2/2 age. hospitalist consulted.   Labs, studies and imaging reviewed by myself and considered in medical decision making if ordered. Imaging interpreted by radiology.  Labs Reviewed  LIPASE, BLOOD - Abnormal; Notable for the following components:      Result Value   Lipase 55 (*)    All other components within normal limits  COMPREHENSIVE METABOLIC PANEL - Abnormal; Notable for the following components:   CO2 21 (*)    Glucose, Bld 133 (*)    Calcium 8.7 (*)    ALT 15 (*)    All other components within normal limits  CBC  URINALYSIS, ROUTINE W REFLEX MICROSCOPIC  TROPONIN I    US Abdomen Limited  Final Result    CT Abdomen Pelvis W Contrast  Final Result    NM Hepatobiliary Liver Func    (Results Pending)    No follow-ups on file.    Jaleiyah Alas, Corene Cornea, MD 02/08/18 [phone]

## 2018-02-06 ENCOUNTER — Inpatient Hospital Stay (HOSPITAL_COMMUNITY): Payer: Medicare Other

## 2018-02-06 DIAGNOSIS — I440 Atrioventricular block, first degree: Secondary | ICD-10-CM

## 2018-02-06 DIAGNOSIS — I351 Nonrheumatic aortic (valve) insufficiency: Secondary | ICD-10-CM

## 2018-02-06 DIAGNOSIS — I5032 Chronic diastolic (congestive) heart failure: Secondary | ICD-10-CM

## 2018-02-06 DIAGNOSIS — Z01818 Encounter for other preprocedural examination: Secondary | ICD-10-CM

## 2018-02-06 LAB — COMPREHENSIVE METABOLIC PANEL
ALBUMIN: 3.4 g/dL — AB (ref 3.5–5.0)
ALK PHOS: 49 U/L (ref 38–126)
ALT: 15 U/L — ABNORMAL LOW (ref 17–63)
ANION GAP: 10 (ref 5–15)
AST: 25 U/L (ref 15–41)
BUN: 14 mg/dL (ref 6–20)
CALCIUM: 8.2 mg/dL — AB (ref 8.9–10.3)
CHLORIDE: 103 mmol/L (ref 101–111)
CO2: 21 mmol/L — ABNORMAL LOW (ref 22–32)
CREATININE: 0.96 mg/dL (ref 0.61–1.24)
GFR calc non Af Amer: >60 mL/min (ref >60)
GLUCOSE: 134 mg/dL — AB (ref 65–99)
Potassium: 3.7 mmol/L (ref 3.5–5.1)
Sodium: 134 mmol/L — ABNORMAL LOW (ref 135–145)
TOTAL PROTEIN: 6 g/dL — AB (ref 6.5–8.1)
Total Bilirubin: 1.1 mg/dL (ref 0.3–1.2)

## 2018-02-06 LAB — CBC
HCT: 38.9 % — ABNORMAL LOW (ref 39.0–52.0)
HEMOGLOBIN: 13.1 g/dL (ref 13.0–17.0)
MCH: 32.1 pg (ref 26.0–34.0)
MCHC: 33.7 g/dL (ref 30.0–36.0)
MCV: 95.3 fL (ref 78.0–100.0)
PLATELETS: 145 10*3/uL — AB (ref 150–400)
RBC: 4.08 MIL/uL — AB (ref 4.22–5.81)
RDW: 12.9 % (ref 11.5–15.5)
WBC: 16.5 10*3/uL — ABNORMAL HIGH (ref 4.0–10.5)

## 2018-02-06 LAB — LIPID PANEL
CHOL/HDL RATIO: 3.3 ratio
Cholesterol: 148 mg/dL (ref 0–200)
HDL: 45 mg/dL (ref >40)
LDL CALC: 90 mg/dL (ref 0–99)
TRIGLYCERIDES: 67 mg/dL (ref <150)
VLDL: 13 mg/dL (ref 0–40)

## 2018-02-06 LAB — ECHOCARDIOGRAM COMPLETE
Height: 69 in
WEIGHTICAEL: 2754.87 [oz_av]

## 2018-02-06 LAB — LIPASE, BLOOD: Lipase: 35 U/L (ref 11–51)

## 2018-02-06 MED ORDER — LIP MEDEX EX OINT
TOPICAL_OINTMENT | CUTANEOUS | Status: AC
  Administered 2018-02-06: 05:00:00
  Filled 2018-02-06: qty 7

## 2018-02-06 NOTE — Progress Notes (Signed)
  Echocardiogram 2D Echocardiogram has been performed.  Jeremy Marks M 02/06/2018, 9:34 AM

## 2018-02-06 NOTE — Progress Notes (Signed)
Subjective/Chief Complaint: abdominal pain Pt feels fine  Denies abdominal pain no nausea or vomiting    Objective: Vital signs in last 24 hours: Temp:  [97.7 F (36.5 C)-98.1 F (36.7 C)] 98.1 F (36.7 C) (05/18 0517) Pulse Rate:  [59-89] 79 (05/18 0517) Resp:  [6-18] 6 (05/18 0517) BP: (120-148)/(51-84) 128/59 (05/18 0517) SpO2:  [79 %-100 %] 79 % (05/18 0517) Weight:  [78.1 kg (172 lb 2.9 oz)] 78.1 kg (172 lb 2.9 oz) (05/17 2015) Last BM Date: 02/04/18  Intake/Output from previous day: 05/17 0701 - 05/18 0700 In: 1880.4 [I.V.:1880.4] Out: -  Intake/Output this shift: No intake/output data recorded.  GI: soft NT no RUQ tenderness negative Murphy's sign    HEENT  No jaundice       CV  RRR  PULM  CTA   Lab Results:  Recent Labs    02/05/18 0741 02/06/18 0420  WBC 8.7 16.5*  HGB 13.8 13.1  HCT 40.5 38.9*  PLT 193 145*   BMET Recent Labs    02/05/18 0741 02/06/18 0420  NA 135 134*  K 3.8 3.7  CL 103 103  CO2 21* 21*  GLUCOSE 133* 134*  BUN 17 14  CREATININE 1.04 0.96  CALCIUM 8.7* 8.2*   PT/INR No results for input(s): LABPROT, INR in the last 72 hours. ABG No results for input(s): PHART, HCO3 in the last 72 hours.  Invalid input(s): PCO2, PO2  Studies/Results: Nm Hepatobiliary Liver Func  Result Date: 02/05/2018 CLINICAL DATA:  Abdominal pain.  Evaluate for acute cholecystitis. EXAM: NUCLEAR MEDICINE HEPATOBILIARY IMAGING TECHNIQUE: Sequential images of the abdomen were obtained out to 60 minutes following intravenous administration of radiopharmaceutical. RADIOPHARMACEUTICALS:  5.45 mCi Tc-52m  Choletec IV COMPARISON:  CT abdomen and pelvis and right upper quadrant ultrasound from same day. FINDINGS: Prompt uptake and biliary excretion of activity by the liver is seen. Gallbladder activity is not visualized. Biliary activity passes into small bowel, consistent with patent common bile duct. IMPRESSION: 1. Nonvisualization of the gallbladder,  consistent with acute cholecystitis. Electronically Signed   By: Titus Dubin M.D.   On: 02/05/2018 17:14   Ct Abdomen Pelvis W Contrast  Result Date: 02/05/2018 CLINICAL DATA:  Upper to mid abdominal pain beginning this morning. Abdominal distention. EXAM: CT ABDOMEN AND PELVIS WITH CONTRAST TECHNIQUE: Multidetector CT imaging of the abdomen and pelvis was performed using the standard protocol following bolus administration of intravenous contrast. CONTRAST:  138mL ISOVUE-300 IOPAMIDOL (ISOVUE-300) INJECTION 61% COMPARISON:  None. FINDINGS: Lower chest: No acute abnormality. Hepatobiliary: Liver is unremarkable. Gallbladder is distended. Although the wall does appear thickened, there is mild hazy inflammation in the fat adjacent to the lower gallbladder and gallbladder neck. No discrete stone is seen. No bile duct dilation. Pancreas: Unremarkable. No pancreatic ductal dilatation or surrounding inflammatory changes. Spleen: Normal in size without focal abnormality. Adrenals/Urinary Tract: Kidneys are normal in position. Symmetric renal enhancement and excretion. Small cyst arises from the lower pole the right kidney. No other masses. No stones. No hydronephrosis. Normal ureters. Bladder is unremarkable. Stomach/Bowel: Stomach and small bowel are unremarkable. There are diverticula scattered throughout the colon. No diverticulitis or other colonic abnormality. Normal appendix visualized. Vascular/Lymphatic: Dense aortic atherosclerotic calcifications. No aneurysm. No enlarged lymph nodes. Reproductive: Marked enlargement of the prostate gland, measuring 9 x 6.1 x 6.4 cm. Other: No abdominal wall hernia or abnormality. No abdominopelvic ascites. Musculoskeletal: Mild compression fracture of L1, most likely chronic. No other fractures. No osteoblastic or osteolytic lesions. IMPRESSION: 1.  Mild hazy inflammation adjacent to the gallbladder. Suspect early acute cholecystitis if this correlates clinically. Consider  follow-up right upper quadrant ultrasound to assess for gallstones not visualized by CT. 2. No other evidence of an acute abnormality. 3. Marked prostatic hypertrophy. 4. Aortic atherosclerosis. 5. Scattered colonic diverticula without diverticulitis. Electronically Signed   By: Lajean Manes M.D.   On: 02/05/2018 09:27   US Abdomen Limited  Result Date: 02/05/2018 CLINICAL DATA:  Right upper quadrant pain. EXAM: ULTRASOUND ABDOMEN LIMITED RIGHT UPPER QUADRANT COMPARISON:  CT scan Feb 05, 2017 FINDINGS: Gallbladder: There is sludge in the gallbladder with no stones, wall thickening, pericholecystic fluid, or Murphy's sign. Common bile duct: Diameter: 3.6 mm Liver: Hepatic steatosis. No focal mass. Portal vein is patent on color Doppler imaging with normal direction of blood flow towards the liver. IMPRESSION: 1. There is sludge in the gallbladder with no wall thickening, pericholecystic fluid, stones, or Murphy's sign. A HIDA scan could further evaluate if clinical concern persists. 2. Hepatic steatosis. Electronically Signed   By: Dorise Bullion III M.D   On: 02/05/2018 10:51   Dg Chest Port 1 View  Result Date: 02/05/2018 CLINICAL DATA:  Nonsmoker, short of breath EXAM: PORTABLE CHEST 1 VIEW COMPARISON:  None. FINDINGS: Heart is enlarged. Low lung volumes. No effusion, infiltrate pneumothorax. Ectatic aorta. IMPRESSION: Low lung volumes.  No acute findings. Electronically Signed   By: Suzy Bouchard M.D.   On: 02/05/2018 20:39    Anti-infectives: Anti-infectives (From admission, onward)   Start     Dose/Rate Route Frequency Ordered Stop   02/05/18 2200  metroNIDAZOLE (FLAGYL) IVPB 500 mg     500 mg 100 mL/hr over 60 Minutes Intravenous Every 8 hours 02/05/18 2033     02/05/18 2100  ciprofloxacin (CIPRO) IVPB 400 mg     400 mg 200 mL/hr over 60 Minutes Intravenous Every 12 hours 02/05/18 2033        Assessment/Plan: Patient Active Problem List   Diagnosis Date Noted  . Acute  cholecystitis 02/05/2018    Pt is pain free with normal exam  No stones and HIDA can be misleading in in the elderly  Advance diet and see how he does  Recheck CBC in am  If symptoms recur,  Plan on lap chole    If he can tolerate diet and remain pain free, nonoperative management     Discussed with the patient    LOS: 1 day    Jeremy Marks A Jeremy Marks 02/06/2018

## 2018-02-06 NOTE — Progress Notes (Addendum)
PROGRESS NOTE  Romen Yutzy YTK:[id number] DOB: 1923/04/10 DOA: 02/05/2018 PCP: Orpah Melter, MD  HPI/Recap of past 24 hours: Kylon Philbrook is a 84 y.o. male with no significant past medical history who presented to [facility] ED with complaints of right upper quadrant and epigastric pain.  Onset early this morning associated with one loose stool.  Denies nausea or vomiting, fevers or chills.  States pain was severe, nonradiating.  He has never experienced any like that in the past.  No previous abdominal surgeries.  Reports occasional wine consumption.  No history of tobacco or illicit drug use.  No family history of sudden cardiac death.  Not on any prescribed medications and follows up with his PCP once a year.  ED Course: Upon presentation to the ED, vital signs are stable.  Afebrile.  Lab studies unremarkable.  EKG personally reviewed revealed first-degree AV block, sinus rhythm with rate of 56 and occasional PVCs.  CT abdomen and pelvis with contrast revealed suspicion for acute cholecystitis Confirmed by HIDA scan.  Abdominal ultrasound revealed hepatic steatosis.  General surgery consulted by ED physician.  Admitted for acute cholecystitis.  02/06/18: No new complaints. Reports no nausea or abd pain. Has flatus. On IV antibiotics. Will advance diet today as recommended by gen surgery.  Assessment/Plan: Active Problems:   Acute cholecystitis  Acute cholecystitis, stable C/w cipro and flagyl Switch to po if patient tolerates feeding General surgery following. Highly appreciated. Continue close following  Diastolic CHF, chronic Grade 1 diastolic dysfunction on 2D echo 02/06/18 I&Os, daily weight Not in exacerbation  Mild aortic regurgitation No aortic stenosis stable  First degree av block Noted  Euvolemic Hyponatremia, mild Na+ 134 Repeat BMP in the morning  Leukokcytosis most likely from acute cholecystitis vs others C/w cipro and  flagyl    Code Status: full  Family Communication: none at bedside   Disposition Plan: home when clinically stable   Consultants:  General surgery  Procedures:  none  Antimicrobials:  cipro and flagyl  DVT prophylaxis:  lovenox sq   Objective: Vitals:   02/05/18 1934 02/05/18 2015 02/06/18 0517 02/06/18 1405  BP: (!) 148/69  (!) 128/59 (!) 132/55  Pulse: (!) 59  79 64  Resp: 17  (!) 6 18  Temp: 97.7 F (36.5 C)  98.1 F (36.7 C)   TempSrc: Oral  Oral   SpO2: 100%  (!) 79% 95%  Weight:  78.1 kg (172 lb 2.9 oz)    Height:  5\' 9"  (1.753 m)      Intake/Output Summary (Last 24 hours) at 02/06/2018 1657 Last data filed at 02/06/2018 1407 Gross per 24 hour  Intake 1930.42 ml  Output 0 ml  Net 1930.42 ml   Filed Weights   02/05/18 2015  Weight: 78.1 kg (172 lb 2.9 oz)    Exam:  . General: 84 y.o. year-old male well developed well nourished in no acute distress.  Alert and oriented x3. . Cardiovascular: Regular rate and rhythm with no rubs or gallops.  No thyromegaly or JVD noted.   Marland Kitchen Respiratory: Clear to auscultation with no wheezes or rales. Good inspiratory effort. . Abdomen: Soft nontender nondistended with normal bowel sounds x4 quadrants. . Musculoskeletal: No lower extremity edema. 2/4 pulses in all 4 extremities. . Skin: No ulcerative lesions noted or rashes, . Psychiatry: Mood is appropriate for condition and setting   Data Reviewed: CBC: Recent Labs  Lab 02/05/18 0741 02/06/18 0420  WBC 8.7 16.5*  HGB 13.8  13.1  HCT 40.5 38.9*  MCV 95.5 95.3  PLT 193 614*   Basic Metabolic Panel: Recent Labs  Lab 02/05/18 0741 02/06/18 0420  NA 135 134*  K 3.8 3.7  CL 103 103  CO2 21* 21*  GLUCOSE 133* 134*  BUN 17 14  CREATININE 1.04 0.96  CALCIUM 8.7* 8.2*   GFR: Estimated Creatinine Clearance: 49.1 mL/min (by C-G formula based on SCr of 0.96 mg/dL). Liver Function Tests: Recent Labs  Lab 02/05/18 0741 02/06/18 0420  AST 21 25  ALT  15* 15*  ALKPHOS 63 49  BILITOT 0.5 1.1  PROT 6.8 6.0*  ALBUMIN 4.1 3.4*   Recent Labs  Lab 02/05/18 0741 02/06/18 0420  LIPASE 55* 35   No results for input(s): AMMONIA in the last 168 hours. Coagulation Profile: No results for input(s): INR, PROTIME in the last 168 hours. Cardiac Enzymes: Recent Labs  Lab 02/05/18 0741  TROPONINI <0.03   BNP (last 3 results) No results for input(s): PROBNP in the last 8760 hours. HbA1C: No results for input(s): HGBA1C in the last 72 hours. CBG: No results for input(s): GLUCAP in the last 168 hours. Lipid Profile: Recent Labs    02/06/18 0420  CHOL 148  HDL 45  LDLCALC 90  TRIG 67  CHOLHDL 3.3   Thyroid Function Tests: Recent Labs    02/05/18 2020  TSH 0.667   Anemia Panel: No results for input(s): VITAMINB12, FOLATE, FERRITIN, TIBC, IRON, RETICCTPCT in the last 72 hours. Urine analysis:    Component Value Date/Time   COLORURINE YELLOW 02/05/2018 0732   APPEARANCEUR CLEAR 02/05/2018 0732   LABSPEC 1.014 02/05/2018 0732   PHURINE 7.0 02/05/2018 0732   GLUCOSEU NEGATIVE 02/05/2018 0732   HGBUR NEGATIVE 02/05/2018 0732   BILIRUBINUR NEGATIVE 02/05/2018 0732   KETONESUR NEGATIVE 02/05/2018 0732   PROTEINUR NEGATIVE 02/05/2018 0732   NITRITE NEGATIVE 02/05/2018 0732   LEUKOCYTESUR NEGATIVE 02/05/2018 0732   Sepsis Labs: @LABRCNTIP (procalcitonin:4,lacticidven:4)  )No results found for this or any previous visit (from the past 240 hour(s)).    Studies: Nm Hepatobiliary Liver Func  Result Date: 02/05/2018 CLINICAL DATA:  Abdominal pain.  Evaluate for acute cholecystitis. EXAM: NUCLEAR MEDICINE HEPATOBILIARY IMAGING TECHNIQUE: Sequential images of the abdomen were obtained out to 60 minutes following intravenous administration of radiopharmaceutical. RADIOPHARMACEUTICALS:  5.45 mCi Tc-47m  Choletec IV COMPARISON:  CT abdomen and pelvis and right upper quadrant ultrasound from same day. FINDINGS: Prompt uptake and biliary  excretion of activity by the liver is seen. Gallbladder activity is not visualized. Biliary activity passes into small bowel, consistent with patent common bile duct. IMPRESSION: 1. Nonvisualization of the gallbladder, consistent with acute cholecystitis. Electronically Signed   By: Titus Dubin M.D.   On: 02/05/2018 17:14   Dg Chest Port 1 View  Result Date: 02/05/2018 CLINICAL DATA:  Nonsmoker, short of breath EXAM: PORTABLE CHEST 1 VIEW COMPARISON:  None. FINDINGS: Heart is enlarged. Low lung volumes. No effusion, infiltrate pneumothorax. Ectatic aorta. IMPRESSION: Low lung volumes.  No acute findings. Electronically Signed   By: Suzy Bouchard M.D.   On: 02/05/2018 20:39    Scheduled Meds: . enoxaparin (LOVENOX) injection  40 mg Subcutaneous Q24H    Continuous Infusions: . sodium chloride 75 mL/hr at 02/05/18 2300  . ciprofloxacin Stopped (02/06/18 1035)  . metronidazole 500 mg (02/06/18 1612)     LOS: 1 day     Kayleen Memos, MD [facility] Pager [phone]  If 7PM-7AM, please contact night-coverage [email or web address] Password  [email or web address] 02/06/2018, 4:57 PM

## 2018-02-07 DIAGNOSIS — E876 Hypokalemia: Secondary | ICD-10-CM

## 2018-02-07 DIAGNOSIS — D72829 Elevated white blood cell count, unspecified: Secondary | ICD-10-CM

## 2018-02-07 LAB — CBC
HEMATOCRIT: 37.2 % — AB (ref 39.0–52.0)
HEMOGLOBIN: 12.7 g/dL — AB (ref 13.0–17.0)
MCH: 32.3 pg (ref 26.0–34.0)
MCHC: 34.1 g/dL (ref 30.0–36.0)
MCV: 94.7 fL (ref 78.0–100.0)
Platelets: 114 10*3/uL — ABNORMAL LOW (ref 150–400)
RBC: 3.93 MIL/uL — AB (ref 4.22–5.81)
RDW: 12.8 % (ref 11.5–15.5)
WBC: 14.5 10*3/uL — AB (ref 4.0–10.5)

## 2018-02-07 LAB — BASIC METABOLIC PANEL
ANION GAP: 9 (ref 5–15)
BUN: 11 mg/dL (ref 6–20)
CHLORIDE: 102 mmol/L (ref 101–111)
CO2: 22 mmol/L (ref 22–32)
Calcium: 8.1 mg/dL — ABNORMAL LOW (ref 8.9–10.3)
Creatinine, Ser: 0.92 mg/dL (ref 0.61–1.24)
GFR calc Af Amer: >60 mL/min (ref >60)
GFR calc non Af Amer: >60 mL/min (ref >60)
Glucose, Bld: 119 mg/dL — ABNORMAL HIGH (ref 65–99)
POTASSIUM: 3.4 mmol/L — AB (ref 3.5–5.1)
Sodium: 133 mmol/L — ABNORMAL LOW (ref 135–145)

## 2018-02-07 LAB — LACTIC ACID, PLASMA: LACTIC ACID, VENOUS: 2.5 mmol/L — AB (ref 0.5–1.9)

## 2018-02-07 MED ORDER — METRONIDAZOLE 500 MG PO TABS
500.0000 mg | ORAL_TABLET | Freq: Three times a day (TID) | ORAL | Status: DC
  Administered 2018-02-07 – 2018-02-08 (×3): 500 mg via ORAL
  Filled 2018-02-07 (×3): qty 1

## 2018-02-07 MED ORDER — POTASSIUM CHLORIDE CRYS ER 20 MEQ PO TBCR
40.0000 meq | EXTENDED_RELEASE_TABLET | Freq: Once | ORAL | Status: AC
  Administered 2018-02-07: 40 meq via ORAL
  Filled 2018-02-07: qty 2

## 2018-02-07 MED ORDER — SODIUM CHLORIDE 0.9 % IV SOLN
INTRAVENOUS | Status: DC
  Administered 2018-02-07: 21:00:00 via INTRAVENOUS

## 2018-02-07 MED ORDER — CIPROFLOXACIN HCL 500 MG PO TABS
500.0000 mg | ORAL_TABLET | Freq: Two times a day (BID) | ORAL | Status: DC
  Administered 2018-02-07 – 2018-02-08 (×2): 500 mg via ORAL
  Filled 2018-02-07 (×2): qty 1

## 2018-02-07 MED ORDER — MAGNESIUM GLUCONATE 500 (27 MG) MG PO TABS
500.0000 mg | ORAL_TABLET | Freq: Once | ORAL | Status: AC
  Administered 2018-02-07: 500 mg via ORAL
  Filled 2018-02-07: qty 1

## 2018-02-07 MED ORDER — SENNOSIDES-DOCUSATE SODIUM 8.6-50 MG PO TABS
1.0000 | ORAL_TABLET | Freq: Two times a day (BID) | ORAL | Status: DC
  Administered 2018-02-07 – 2018-02-08 (×2): 1 via ORAL
  Filled 2018-02-07 (×2): qty 1

## 2018-02-07 NOTE — Progress Notes (Addendum)
PROGRESS NOTE  Jeremy Marks CHE:[id number] DOB: 28-Dec-1922 DOA: 02/05/2018 PCP: Orpah Melter, MD  HPI/Recap of past 24 hours: Jeremy Marks is a 84 y.o. male with no significant past medical history who presented to [facility] ED with complaints of right upper quadrant and epigastric pain.  Onset early this morning associated with one loose stool.  Denies nausea or vomiting, fevers or chills.  States pain was severe, nonradiating.  He has never experienced any like that in the past.  No previous abdominal surgeries.  Reports occasional wine consumption.  No history of tobacco or illicit drug use.  No family history of sudden cardiac death.  Not on any prescribed medications and follows up with his PCP once a year.  ED Course: Upon presentation to the ED, vital signs are stable.  Afebrile.  Lab studies unremarkable.  EKG personally reviewed revealed first-degree AV block, sinus rhythm with rate of 56 and occasional PVCs.  CT abdomen and pelvis with contrast revealed suspicion for acute cholecystitis Confirmed by HIDA scan.  Abdominal ultrasound revealed hepatic steatosis.  General surgery consulted by ED physician.  Admitted for acute cholecystitis.  02/06/18: No new complaints. Reports no nausea or abd pain. Has flatus. On IV antibiotics. Will advance diet today as recommended by gen surgery.  02/07/18: Denies abd pain or nausea. Tmax 100.3 overnight with intermittent low grade temperatures. Encouraged to eat a reg diet but patient does not have his denture. Puree diet ordered. Leukocytosis is improving but persists. On IV cipro and IV flagyl.   Assessment/Plan: Active Problems:   Acute cholecystitis  Acute cholecystitis, appears stable Switch IV Cipro and IV Flagyl to p.o. Cipro and p.o. Flagyl Febrile overnight with a T-max of 100.3 Leukocytosis persistent however is improving with WBC 14 from 16k Obtain lactic acid level Continue to monitor fever curve  General  surgery consulted and recommended conservative management.  Oral antibiotics x7 days. CBC in the morning  Leukocytosis/fever Suspect secondary to acute cholecystitis Continue antibiotics Repeat CBC in the morning Monitor fever curve  Hypokalemia K+ 3.1 Repleted with po kcl and po magnesium Obtain BMP and magnesium level in the am  Chronic diastolic CHF, stable  Grade 1 diastolic dysfunction on 2D echo 02/06/18 I&Os, daily weight Not in exacerbation  Mild aortic regurgitation, stable No aortic stenosis Stable  First-degree AV block, stable Noted  Euvolemic hyponatremia, stable Repeat BMP in the morning Na+ 134 Repeat BMP in the morning   Code Status: full  Family Communication: none at bedside   Disposition Plan: home when clinically stable   Consultants:  General surgery  Procedures:  none  Antimicrobials:  cipro and flagyl  DVT prophylaxis:  lovenox sq   Objective: Vitals:   02/06/18 1405 02/06/18 2221 02/07/18 0644 02/07/18 1400  BP: (!) 132/55 (!) 121/58 135/63 138/71  Pulse: 64 70 75 89  Resp: 18 16 18 16   Temp:  100.3 F (37.9 C) 98.3 F (36.8 C) 99.5 F (37.5 C)  TempSrc:  Oral Oral   SpO2: 95% 94% 96% 94%  Weight:      Height:        Intake/Output Summary (Last 24 hours) at 02/07/2018 1550 Last data filed at 02/07/2018 1402 Gross per 24 hour  Intake 1900 ml  Output 650 ml  Net 1250 ml   Filed Weights   02/05/18 2015  Weight: 78.1 kg (172 lb 2.9 oz)    Exam: 02/07/2018  . General: 84 y.o. year-old male developed well-nourished in  no acute distress.  Alert and oriented x3.  Cardiovascular: Regular rate and rhythm with no rubs or gallops.  No thyromegaly or JVD present.   Marland Kitchen Respiratory: Clear to auscultation with no wheezes or rales. Good inspiratory effort. . Abdomen: Soft nontender nondistended with normal bowel sounds x4 quadrants. . Musculoskeletal: No lower extremity edema. 2/4 pulses in all 4 extremities. . Skin: No  ulcerative lesions noted or rashes, . Psychiatry: Mood is appropriate for condition and setting   Data Reviewed: CBC: Recent Labs  Lab 02/05/18 0741 02/06/18 0420 02/07/18 0551  WBC 8.7 16.5* 14.5*  HGB 13.8 13.1 12.7*  HCT 40.5 38.9* 37.2*  MCV 95.5 95.3 94.7  PLT 193 145* 062*   Basic Metabolic Panel: Recent Labs  Lab 02/05/18 0741 02/06/18 0420 02/07/18 0551  NA 135 134* 133*  K 3.8 3.7 3.4*  CL 103 103 102  CO2 21* 21* 22  GLUCOSE 133* 134* 119*  BUN 17 14 11   CREATININE 1.04 0.96 0.92  CALCIUM 8.7* 8.2* 8.1*   GFR: Estimated Creatinine Clearance: 51.2 mL/min (by C-G formula based on SCr of 0.92 mg/dL). Liver Function Tests: Recent Labs  Lab 02/05/18 0741 02/06/18 0420  AST 21 25  ALT 15* 15*  ALKPHOS 63 49  BILITOT 0.5 1.1  PROT 6.8 6.0*  ALBUMIN 4.1 3.4*   Recent Labs  Lab 02/05/18 0741 02/06/18 0420  LIPASE 55* 35   No results for input(s): AMMONIA in the last 168 hours. Coagulation Profile: No results for input(s): INR, PROTIME in the last 168 hours. Cardiac Enzymes: Recent Labs  Lab 02/05/18 0741  TROPONINI <0.03   BNP (last 3 results) No results for input(s): PROBNP in the last 8760 hours. HbA1C: No results for input(s): HGBA1C in the last 72 hours. CBG: No results for input(s): GLUCAP in the last 168 hours. Lipid Profile: Recent Labs    02/06/18 0420  CHOL 148  HDL 45  LDLCALC 90  TRIG 67  CHOLHDL 3.3   Thyroid Function Tests: Recent Labs    02/05/18 2020  TSH 0.667   Anemia Panel: No results for input(s): VITAMINB12, FOLATE, FERRITIN, TIBC, IRON, RETICCTPCT in the last 72 hours. Urine analysis:    Component Value Date/Time   COLORURINE YELLOW 02/05/2018 0732   APPEARANCEUR CLEAR 02/05/2018 0732   LABSPEC 1.014 02/05/2018 0732   PHURINE 7.0 02/05/2018 0732   GLUCOSEU NEGATIVE 02/05/2018 0732   HGBUR NEGATIVE 02/05/2018 0732   BILIRUBINUR NEGATIVE 02/05/2018 0732   KETONESUR NEGATIVE 02/05/2018 0732   PROTEINUR  NEGATIVE 02/05/2018 0732   NITRITE NEGATIVE 02/05/2018 0732   LEUKOCYTESUR NEGATIVE 02/05/2018 0732   Sepsis Labs: @LABRCNTIP (procalcitonin:4,lacticidven:4)  )No results found for this or any previous visit (from the past 240 hour(s)).    Studies: No results found.  Scheduled Meds: . ciprofloxacin  500 mg Oral BID  . enoxaparin (LOVENOX) injection  40 mg Subcutaneous Q24H  . metroNIDAZOLE  500 mg Oral Q8H    Continuous Infusions:    LOS: 2 days     Kayleen Memos, MD [facility] Pager [phone]  If 7PM-7AM, please contact night-coverage [email or web address] Password [email or web address] 02/07/2018, 3:50 PM

## 2018-02-07 NOTE — Progress Notes (Signed)
Subjective/Chief Complaint: Pt denies pain ate some yesterday but does not have his teeth   Objective: Vital signs in last 24 hours: Temp:  [98.3 F (36.8 C)-100.3 F (37.9 C)] 98.3 F (36.8 C) (05/19 0644) Pulse Rate:  [64-75] 75 (05/19 0644) Resp:  [16-18] 18 (05/19 0644) BP: (121-135)/(55-63) 135/63 (05/19 0644) SpO2:  [94 %-96 %] 96 % (05/19 0644) Last BM Date: 02/04/18  Intake/Output from previous day: 05/18 0701 - 05/19 0700 In: 1220 [P.O.:320; IV Piggyback:900] Out: 650 [Urine:650] Intake/Output this shift: No intake/output data recorded.  GI: soft, non-tender; bowel sounds normal; no masses,  no organomegaly  Lab Results:  Recent Labs    02/06/18 0420 02/07/18 0551  WBC 16.5* 14.5*  HGB 13.1 12.7*  HCT 38.9* 37.2*  PLT 145* PENDING   BMET Recent Labs    02/06/18 0420 02/07/18 0551  NA 134* 133*  K 3.7 3.4*  CL 103 102  CO2 21* 22  GLUCOSE 134* 119*  BUN 14 11  CREATININE 0.96 0.92  CALCIUM 8.2* 8.1*   PT/INR No results for input(s): LABPROT, INR in the last 72 hours. ABG No results for input(s): PHART, HCO3 in the last 72 hours.  Invalid input(s): PCO2, PO2  Studies/Results: Nm Hepatobiliary Liver Func  Result Date: 02/05/2018 CLINICAL DATA:  Abdominal pain.  Evaluate for acute cholecystitis. EXAM: NUCLEAR MEDICINE HEPATOBILIARY IMAGING TECHNIQUE: Sequential images of the abdomen were obtained out to 60 minutes following intravenous administration of radiopharmaceutical. RADIOPHARMACEUTICALS:  5.45 mCi Tc-74m  Choletec IV COMPARISON:  CT abdomen and pelvis and right upper quadrant ultrasound from same day. FINDINGS: Prompt uptake and biliary excretion of activity by the liver is seen. Gallbladder activity is not visualized. Biliary activity passes into small bowel, consistent with patent common bile duct. IMPRESSION: 1. Nonvisualization of the gallbladder, consistent with acute cholecystitis. Electronically Signed   By: Titus Dubin M.D.    On: 02/05/2018 17:14   Ct Abdomen Pelvis W Contrast  Result Date: 02/05/2018 CLINICAL DATA:  Upper to mid abdominal pain beginning this morning. Abdominal distention. EXAM: CT ABDOMEN AND PELVIS WITH CONTRAST TECHNIQUE: Multidetector CT imaging of the abdomen and pelvis was performed using the standard protocol following bolus administration of intravenous contrast. CONTRAST:  143mL ISOVUE-300 IOPAMIDOL (ISOVUE-300) INJECTION 61% COMPARISON:  None. FINDINGS: Lower chest: No acute abnormality. Hepatobiliary: Liver is unremarkable. Gallbladder is distended. Although the wall does appear thickened, there is mild hazy inflammation in the fat adjacent to the lower gallbladder and gallbladder neck. No discrete stone is seen. No bile duct dilation. Pancreas: Unremarkable. No pancreatic ductal dilatation or surrounding inflammatory changes. Spleen: Normal in size without focal abnormality. Adrenals/Urinary Tract: Kidneys are normal in position. Symmetric renal enhancement and excretion. Small cyst arises from the lower pole the right kidney. No other masses. No stones. No hydronephrosis. Normal ureters. Bladder is unremarkable. Stomach/Bowel: Stomach and small bowel are unremarkable. There are diverticula scattered throughout the colon. No diverticulitis or other colonic abnormality. Normal appendix visualized. Vascular/Lymphatic: Dense aortic atherosclerotic calcifications. No aneurysm. No enlarged lymph nodes. Reproductive: Marked enlargement of the prostate gland, measuring 9 x 6.1 x 6.4 cm. Other: No abdominal wall hernia or abnormality. No abdominopelvic ascites. Musculoskeletal: Mild compression fracture of L1, most likely chronic. No other fractures. No osteoblastic or osteolytic lesions. IMPRESSION: 1. Mild hazy inflammation adjacent to the gallbladder. Suspect early acute cholecystitis if this correlates clinically. Consider follow-up right upper quadrant ultrasound to assess for gallstones not visualized by  CT. 2. No other evidence of  an acute abnormality. 3. Marked prostatic hypertrophy. 4. Aortic atherosclerosis. 5. Scattered colonic diverticula without diverticulitis. Electronically Signed   By: Lajean Manes M.D.   On: 02/05/2018 09:27   US Abdomen Limited  Result Date: 02/05/2018 CLINICAL DATA:  Right upper quadrant pain. EXAM: ULTRASOUND ABDOMEN LIMITED RIGHT UPPER QUADRANT COMPARISON:  CT scan Feb 05, 2017 FINDINGS: Gallbladder: There is sludge in the gallbladder with no stones, wall thickening, pericholecystic fluid, or Murphy's sign. Common bile duct: Diameter: 3.6 mm Liver: Hepatic steatosis. No focal mass. Portal vein is patent on color Doppler imaging with normal direction of blood flow towards the liver. IMPRESSION: 1. There is sludge in the gallbladder with no wall thickening, pericholecystic fluid, stones, or Murphy's sign. A HIDA scan could further evaluate if clinical concern persists. 2. Hepatic steatosis. Electronically Signed   By: Dorise Bullion III M.D   On: 02/05/2018 10:51   Dg Chest Port 1 View  Result Date: 02/05/2018 CLINICAL DATA:  Nonsmoker, short of breath EXAM: PORTABLE CHEST 1 VIEW COMPARISON:  None. FINDINGS: Heart is enlarged. Low lung volumes. No effusion, infiltrate pneumothorax. Ectatic aorta. IMPRESSION: Low lung volumes.  No acute findings. Electronically Signed   By: Suzy Bouchard M.D.   On: 02/05/2018 20:39    Anti-infectives: Anti-infectives (From admission, onward)   Start     Dose/Rate Route Frequency Ordered Stop   02/05/18 2200  metroNIDAZOLE (FLAGYL) IVPB 500 mg     500 mg 100 mL/hr over 60 Minutes Intravenous Every 8 hours 02/05/18 2033     02/05/18 2100  ciprofloxacin (CIPRO) IVPB 400 mg     400 mg 200 mL/hr over 60 Minutes Intravenous Every 12 hours 02/05/18 2033        Assessment/Plan: Abdominal pain Cholecystitis better - no pain and WBC improving  If he tolerated breakfast he can go home on ABX for 7 days He has  No interest in surgery  at this point and is asymptomatic  Would not operate unless he becomes symptomatic    LOS: 2 days    Joyice Faster [place]ia 02/07/2018

## 2018-02-08 DIAGNOSIS — E871 Hypo-osmolality and hyponatremia: Secondary | ICD-10-CM

## 2018-02-08 LAB — BASIC METABOLIC PANEL
Anion gap: 8 (ref 5–15)
BUN: 9 mg/dL (ref 6–20)
CHLORIDE: 104 mmol/L (ref 101–111)
CO2: 20 mmol/L — ABNORMAL LOW (ref 22–32)
Calcium: 7.8 mg/dL — ABNORMAL LOW (ref 8.9–10.3)
Creatinine, Ser: 0.8 mg/dL (ref 0.61–1.24)
GFR calc non Af Amer: >60 mL/min (ref >60)
Glucose, Bld: 121 mg/dL — ABNORMAL HIGH (ref 65–99)
POTASSIUM: 3.8 mmol/L (ref 3.5–5.1)
SODIUM: 132 mmol/L — AB (ref 135–145)

## 2018-02-08 LAB — CBC
HCT: 35.5 % — ABNORMAL LOW (ref 39.0–52.0)
HEMOGLOBIN: 12.2 g/dL — AB (ref 13.0–17.0)
MCH: 32.1 pg (ref 26.0–34.0)
MCHC: 34.4 g/dL (ref 30.0–36.0)
MCV: 93.4 fL (ref 78.0–100.0)
Platelets: 123 10*3/uL — ABNORMAL LOW (ref 150–400)
RBC: 3.8 MIL/uL — AB (ref 4.22–5.81)
RDW: 12.9 % (ref 11.5–15.5)
WBC: 14.9 10*3/uL — ABNORMAL HIGH (ref 4.0–10.5)

## 2018-02-08 LAB — LACTIC ACID, PLASMA: LACTIC ACID, VENOUS: 1.1 mmol/L (ref 0.5–1.9)

## 2018-02-08 LAB — MAGNESIUM: Magnesium: 1.7 mg/dL (ref 1.7–2.4)

## 2018-02-08 MED ORDER — MAGNESIUM SULFATE 2 GM/50ML IV SOLN
2.0000 g | Freq: Once | INTRAVENOUS | Status: AC
  Administered 2018-02-08: 2 g via INTRAVENOUS
  Filled 2018-02-08 (×2): qty 50

## 2018-02-08 MED ORDER — CIPROFLOXACIN HCL 500 MG PO TABS: 500.0000 mg | ORAL_TABLET | Freq: Two times a day (BID) | ORAL | 0 refills | Status: AC

## 2018-02-08 MED ORDER — METRONIDAZOLE 500 MG PO TABS: 500.0000 mg | ORAL_TABLET | Freq: Three times a day (TID) | ORAL | 0 refills | Status: AC

## 2018-02-08 NOTE — Progress Notes (Signed)
Discharge instructions reviewed with patient. All questions answered. Patient wheeled to vehicle with belongings by nurse tech. 

## 2018-02-08 NOTE — Discharge Instructions (Signed)
Cholecystitis °Cholecystitis is swelling and irritation (inflammation) of the gallbladder. The gallbladder is an organ that is shaped like a pear. It is under the liver on the right side of the body. This condition is often caused by gallstones. You doctor may do tests to see how your gallbladder works. These tests may include: °· Imaging tests, such as: °? An ultrasound. °? MRI. °· Tests that check how your liver works. ° °This condition needs treatment. °Follow these instructions at home: °Home care will depend on your treatment. In general: °· Take over-the-counter and prescription medicines only as told by your doctor. °· If you were prescribed an antibiotic medicine, take it as told by your doctor. Do not stop taking the antibiotic even if you start to feel better. °· Follow instructions from your doctor about what to eat or drink. When you are allowed to eat, avoid eating or drinking anything that causes your symptoms to start. °· Keep all follow-up visits as told by your doctor. This is important. ° °Contact a doctor if: °· You have pain and your medicine does not help. °· You have a fever. °Get help right away if: °· Your pain moves to: °? Another part of your belly (abdomen). °? Your back. °· Your symptoms do not go away. °· You have new symptoms. °This information is not intended to replace advice given to you by your health care provider. Make sure you discuss any questions you have with your health care provider. °Document Released: 08/28/2011 Document Revised: 02/14/2016 Document Reviewed: 12/20/2014 °Elsevier Interactive Patient Education © 2018 [employer] ° °

## 2018-02-08 NOTE — Evaluation (Signed)
Physical Therapy One Time Evaluation Patient Details Name: Manpreet Strey MRN: [id number] DOB: [birth year] Today's Date: 02/08/2018   History of Present Illness  Pt is a 84 year old male admitted for acute cholecystitis  Clinical Impression  Patient evaluated by Physical Therapy with no further acute PT needs identified. All education has been completed and the patient has no further questions.  Pt agreeable to ambulate in hallway prior to d/c as he has only been in his hospital room since admission.  Pt able to ambulate around unit with improvement in gait pattern with increased distance.  Pt to d/c home today.  Recommended having RW within reach (just in case needed since he lives alone). See below for any follow-up Physical Therapy or equipment needs. PT is signing off. Thank you for this referral.     Follow Up Recommendations No PT follow up    Equipment Recommendations  None recommended by PT    Recommendations for Other Services       Precautions / Restrictions Precautions Precautions: Fall      Mobility  Bed Mobility               General bed mobility comments: pt up in recliner on arrival  Transfers Overall transfer level: Needs assistance Equipment used: None Transfers: Sit to/from Stand Sit to Stand: Supervision            Ambulation/Gait Ambulation/Gait assistance: Supervision;Min guard Ambulation Distance (Feet): 400 Feet Assistive device: None Gait Pattern/deviations: Step-through pattern;Decreased stride length     General Gait Details: slow, slightly unsteady gait however improved with distance, no overt LOB  Stairs            Wheelchair Mobility    Modified Rankin (Stroke Patients Only)       Balance Overall balance assessment: Mild deficits observed, not formally tested(reports only one fall last year, 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)                                           Pertinent  Vitals/Pain Pain Assessment: No/denies pain    Home Living Family/patient expects to be discharged to:: Private residence Living Arrangements: Alone   Type of Home: House Home Access: Stairs to enter Entrance Stairs-Rails: Right Entrance Stairs-Number of Steps: 3 Home Layout: One level Home Equipment: Environmental consultant - 2 wheels      Prior Function Level of Independence: Independent               Hand Dominance        Extremity/Trunk Assessment        Lower Extremity Assessment Lower Extremity Assessment: Overall WFL for tasks assessed       Communication   Communication: No difficulties  Cognition Arousal/Alertness: Awake/alert Behavior During Therapy: WFL for tasks assessed/performed Overall Cognitive Status: Within Functional Limits for tasks assessed                                        General Comments      Exercises     Assessment/Plan    PT Assessment Patent does not need any further PT services  PT Problem List         PT Treatment Interventions      PT Goals (Current goals  can be found in the Care Plan section)  Acute Rehab PT Goals PT Goal Formulation: All assessment and education complete, DC therapy    Frequency     Barriers to discharge        Co-evaluation               AM-PAC PT "6 Clicks" Daily Activity  Outcome Measure Difficulty turning over in bed (including adjusting bedclothes, sheets and blankets)?: None Difficulty moving from lying on back to sitting on the side of the bed? : None Difficulty sitting down on and standing up from a chair with arms (e.g., wheelchair, bedside commode, etc,.)?: None Help needed moving to and from a bed to chair (including a wheelchair)?: None Help needed walking in hospital room?: A Little Help needed climbing 3-5 steps with a railing? : A Little 6 Click Score: 22    End of Session   Activity Tolerance: Patient tolerated treatment well Patient left: in chair;with call  bell/phone within reach;with family/visitor present Nurse Communication: Mobility status PT Visit Diagnosis: Difficulty in walking, not elsewhere classified (R26.2)    Time: 8366-2947 PT Time Calculation (min) (ACUTE ONLY): 14 min   Charges:   PT Evaluation $PT Eval Low Complexity: 1 Low     PT G CodesCarmelia Bake, PT, DPT 02/08/2018 Pager: [phone]  York Ram E 02/08/2018, 3:30 PM

## 2018-02-08 NOTE — Discharge Summary (Addendum)
Discharge Summary  Hilery Wintle Grattan ZOX:[id number] DOB: [birth year]  PCP: Orpah Melter, MD  Admit date: 02/05/2018 Discharge date: 02/08/2018  Time spent: 25 mins  Recommendations for Outpatient Follow-up:  1. Follow up with PCP 2. Take your medications as prescribed  Discharge Diagnoses:  Active Hospital Problems   Diagnosis Date Noted  . Acute cholecystitis 02/05/2018    Resolved Hospital Problems  No resolved problems to display.    Discharge Condition: stable  Diet recommendation: Resume previous diet  Vitals:   02/07/18 2039 02/08/18 0502  BP: (!) 108/49 134/67  Pulse: 76 76  Resp: 16 16  Temp: 97.8 F (36.6 C) 99.1 F (37.3 C)  SpO2: 96% 95%    History of present illness:  Zaki Gertsch Moagis a 84 y.o.malewithno significant past medical historywho [facility] with complaints of right upper quadrant and epigastric pain. Onset early this morning associated with one loose stool. Denies nausea or vomiting,fevers or chills. States pain was severe, nonradiating. He has never experienced any like that in the past. No previous abdominal surgeries. Reports occasional wineconsumption. No history of tobacco or illicit drug use. No family history of sudden cardiac death. Not onany prescribed medications and follows up with hisPCP oncea year.  ED Course:Upon presentation to the ED, vital signs are stable. Afebrile. Lab studies unremarkable. EKG personally reviewed revealed first-degree AV block, sinus rhythm with rate of 56 and occasional PVCs.  CT abdomen and pelvis with contrast revealed suspicion for acute cholecystitis Confirmed by HIDA scan.  Abdominal ultrasound revealed hepatic steatosis. General surgery consulted by ED physician.  Admitted for acute cholecystitis.  02/06/18: No new complaints. Reports no nausea or abd pain. Has flatus. On IV antibiotics. Will advance diet today as recommended by gen surgery.  02/07/18:  Denies abd pain or nausea. Tmax 100.3 overnight with intermittent low grade temperatures. Encouraged to eat a reg diet but patient does not have his denture. Puree diet ordered. Leukocytosis is improving but persists. On IV cipro and IV flagyl.   02/08/18: No events overnight. No new complaints. He denies any abdominal pain or nausea. Tolerates a diet well.  On the day of discharge, the patient was hemodynamically stable. He will need to f/u with PCP post hospitalization.  Hospital Course:  Active Problems:   Acute cholecystitis  Acute cholecystitis, appears stable Switch IV Cipro and IV Flagyl to p.o. Cipro and p.o. Flagyl Tmax 99.1 Leukocytosis improving  Lactic acid improving Oral antibiotics cipro and flagyl x7 days Gen surgery followed and signed off.  Leukocytosis/fever, improving Suspect secondary to acute cholecystitis Continue antibiotics  Hypokalemia, resolved K+ 3.8 Repleted with po kcl and po magnesium  Hypomagnesemia Repleted  Chronic diastolic CHF, stable  Grade 1 diastolic dysfunction on 2D echo 02/06/18 I&Os, daily weight Not in exacerbation  Mild aortic regurgitation, stable No aortic stenosis Stable  First-degree AV block, stable Noted  Euvolemic hyponatremia, stable Follow up with PCP  Procedures:  None  Consultations:  Gen surgery  Discharge Exam: BP 134/67 (BP Location: Left Arm)   Pulse 76   Temp 99.1 F (37.3 C) (Oral)   Resp 16   Ht 5\' 9"  (1.753 m)   Wt 78.1 kg (172 lb 2.9 oz)   SpO2 95%   BMI 25.43 kg/m  . General: 84 y.o. year-old male well developed well nourished in no acute distress.  Alert and oriented x3. . Cardiovascular: Regular rate and rhythm with no rubs or gallops.  No thyromegaly or JVD noted.   Marland Kitchen  Respiratory: Clear to auscultation with no wheezes or rales. Good inspiratory effort. . Abdomen: Soft nontender nondistended with normal bowel sounds x4 quadrants. . Musculoskeletal: No lower extremity edema. 2/4  pulses in all 4 extremities. . Skin: No ulcerative lesions noted or rashes, . Psychiatry: Mood is appropriate for condition and setting  Discharge Instructions You were cared for by a hospitalist during your hospital stay. If you have any questions about your discharge medications or the care you received while you were in the hospital after you are discharged, you can call the unit and asked to speak with the hospitalist on call if the hospitalist that took care of you is not available. Once you are discharged, your primary care physician will handle any further medical issues. Please note that NO REFILLS for any discharge medications will be authorized once you are discharged, as it is imperative that you return to your primary care physician (or establish a relationship with a primary care physician if you do not have one) for your aftercare needs so that they can reassess your need for medications and monitor your lab values.   Allergies as of 02/08/2018      Reactions   Penicillins Anaphylaxis      Medication List    TAKE these medications   acidophilus Caps capsule Take 1 capsule by mouth daily.   bisacodyl 5 MG EC tablet Commonly known as:  DULCOLAX Take 5 mg by mouth daily as needed for moderate constipation.   bismuth subsalicylate 027 OZ/36UY suspension Commonly known as:  PEPTO BISMOL Take 30 mLs by mouth every 6 (six) hours as needed for indigestion.   ciprofloxacin 500 MG tablet Commonly known as:  CIPRO Take 1 tablet (500 mg total) by mouth 2 (two) times daily for 4 days.   dorzolamide-timolol 22.3-6.8 MG/ML ophthalmic solution Commonly known as:  COSOPT Place 1 drop into both eyes 2 (two) times daily.   latanoprost 0.005 % ophthalmic solution Commonly known as:  XALATAN Place 1 drop into both eyes at bedtime.   metroNIDAZOLE 500 MG tablet Commonly known as:  FLAGYL Take 1 tablet (500 mg total) by mouth every 8 (eight) hours for 4 days.      Allergies    Allergen Reactions  . Penicillins Anaphylaxis   Follow-up Information    Orpah Melter, MD Follow up in 2 day(s).   Specialty:  Family Medicine Why:  call office for appointment Contact information: [place] [address] [phone]            The results of significant diagnostics from this hospitalization (including imaging, microbiology, ancillary and laboratory) are listed below for reference.    Significant Diagnostic Studies: Nm Hepatobiliary Liver Func  Result Date: 02/05/2018 CLINICAL DATA:  Abdominal pain.  Evaluate for acute cholecystitis. EXAM: NUCLEAR MEDICINE HEPATOBILIARY IMAGING TECHNIQUE: Sequential images of the abdomen were obtained out to 60 minutes following intravenous administration of radiopharmaceutical. RADIOPHARMACEUTICALS:  5.45 mCi Tc-24m  Choletec IV COMPARISON:  CT abdomen and pelvis and right upper quadrant ultrasound from same day. FINDINGS: Prompt uptake and biliary excretion of activity by the liver is seen. Gallbladder activity is not visualized. Biliary activity passes into small bowel, consistent with patent common bile duct. IMPRESSION: 1. Nonvisualization of the gallbladder, consistent with acute cholecystitis. Electronically Signed   By: Titus Dubin M.D.   On: 02/05/2018 17:14   Ct Abdomen Pelvis W Contrast  Result Date: 02/05/2018 CLINICAL DATA:  Upper to mid abdominal pain beginning  this morning. Abdominal distention. EXAM: CT ABDOMEN AND PELVIS WITH CONTRAST TECHNIQUE: Multidetector CT imaging of the abdomen and pelvis was performed using the standard protocol following bolus administration of intravenous contrast. CONTRAST:  16mL ISOVUE-300 IOPAMIDOL (ISOVUE-300) INJECTION 61% COMPARISON:  None. FINDINGS: Lower chest: No acute abnormality. Hepatobiliary: Liver is unremarkable. Gallbladder is distended. Although the wall does appear thickened, there is mild hazy inflammation in the fat adjacent to the lower  gallbladder and gallbladder neck. No discrete stone is seen. No bile duct dilation. Pancreas: Unremarkable. No pancreatic ductal dilatation or surrounding inflammatory changes. Spleen: Normal in size without focal abnormality. Adrenals/Urinary Tract: Kidneys are normal in position. Symmetric renal enhancement and excretion. Small cyst arises from the lower pole the right kidney. No other masses. No stones. No hydronephrosis. Normal ureters. Bladder is unremarkable. Stomach/Bowel: Stomach and small bowel are unremarkable. There are diverticula scattered throughout the colon. No diverticulitis or other colonic abnormality. Normal appendix visualized. Vascular/Lymphatic: Dense aortic atherosclerotic calcifications. No aneurysm. No enlarged lymph nodes. Reproductive: Marked enlargement of the prostate gland, measuring 9 x 6.1 x 6.4 cm. Other: No abdominal wall hernia or abnormality. No abdominopelvic ascites. Musculoskeletal: Mild compression fracture of L1, most likely chronic. No other fractures. No osteoblastic or osteolytic lesions. IMPRESSION: 1. Mild hazy inflammation adjacent to the gallbladder. Suspect early acute cholecystitis if this correlates clinically. Consider follow-up right upper quadrant ultrasound to assess for gallstones not visualized by CT. 2. No other evidence of an acute abnormality. 3. Marked prostatic hypertrophy. 4. Aortic atherosclerosis. 5. Scattered colonic diverticula without diverticulitis. Electronically Signed   By: Lajean Manes M.D.   On: 02/05/2018 09:27   US Abdomen Limited  Result Date: 02/05/2018 CLINICAL DATA:  Right upper quadrant pain. EXAM: ULTRASOUND ABDOMEN LIMITED RIGHT UPPER QUADRANT COMPARISON:  CT scan Feb 05, 2017 FINDINGS: Gallbladder: There is sludge in the gallbladder with no stones, wall thickening, pericholecystic fluid, or Murphy's sign. Common bile duct: Diameter: 3.6 mm Liver: Hepatic steatosis. No focal mass. Portal vein is patent on color Doppler imaging  with normal direction of blood flow towards the liver. IMPRESSION: 1. There is sludge in the gallbladder with no wall thickening, pericholecystic fluid, stones, or Murphy's sign. A HIDA scan could further evaluate if clinical concern persists. 2. Hepatic steatosis. Electronically Signed   By: Dorise Bullion III M.D   On: 02/05/2018 10:51   Dg Chest Port 1 View  Result Date: 02/05/2018 CLINICAL DATA:  Nonsmoker, short of breath EXAM: PORTABLE CHEST 1 VIEW COMPARISON:  None. FINDINGS: Heart is enlarged. Low lung volumes. No effusion, infiltrate pneumothorax. Ectatic aorta. IMPRESSION: Low lung volumes.  No acute findings. Electronically Signed   By: Suzy Bouchard M.D.   On: 02/05/2018 20:39    Microbiology: No results found for this or any previous visit (from the past 240 hour(s)).   Labs: Basic Metabolic Panel: Recent Labs  Lab 02/05/18 0741 02/06/18 0420 02/07/18 0551 02/08/18 0428  NA 135 134* 133* 132*  K 3.8 3.7 3.4* 3.8  CL 103 103 102 104  CO2 21* 21* 22 20*  GLUCOSE 133* 134* 119* 121*  BUN 17 14 11 9   CREATININE 1.04 0.96 0.92 0.80  CALCIUM 8.7* 8.2* 8.1* 7.8*  MG  --   --   --  1.7   Liver Function Tests: Recent Labs  Lab 02/05/18 0741 02/06/18 0420  AST 21 25  ALT 15* 15*  ALKPHOS 63 49  BILITOT 0.5 1.1  PROT 6.8 6.0*  ALBUMIN 4.1 3.4*  Recent Labs  Lab 02/05/18 0741 02/06/18 0420  LIPASE 55* 35   No results for input(s): AMMONIA in the last 168 hours. CBC: Recent Labs  Lab 02/05/18 0741 02/06/18 0420 02/07/18 0551 02/08/18 0428  WBC 8.7 16.5* 14.5* 14.9*  HGB 13.8 13.1 12.7* 12.2*  HCT 40.5 38.9* 37.2* 35.5*  MCV 95.5 95.3 94.7 93.4  PLT 193 145* 114* 123*   Cardiac Enzymes: Recent Labs  Lab 02/05/18 0741  TROPONINI <0.03   BNP: BNP (last 3 results) No results for input(s): BNP in the last 8760 hours.  ProBNP (last 3 results) No results for input(s): PROBNP in the last 8760 hours.  CBG: No results for input(s): GLUCAP in the  last 168 hours.     Signed:  Kayleen Memos, MD [facility] 02/08/2018, 12:34 PM

## 2018-02-08 NOTE — Progress Notes (Signed)
Patient ID: Jeremy Marks, male   DOB: October 24, 1922, 84 y.o.   MRN: [id number]       Subjective: Patient feels well this morning.  He has no complaints.  He is tolerating a regular diet, denies any pain, and is moving his bowels.  Objective: Vital signs in last 24 hours: Temp:  [97.8 F (36.6 C)-99.5 F (37.5 C)] 99.1 F (37.3 C) (05/20 0502) Pulse Rate:  [76-89] 76 (05/20 0502) Resp:  [16-18] 16 (05/20 0502) BP: (108-138)/(49-71) 134/67 (05/20 0502) SpO2:  [94 %-96 %] 95 % (05/20 0502) Last BM Date: 02/07/18  Intake/Output from previous day: 05/19 0701 - 05/20 0700 In: 3810 [P.O.:480; I.V.:705; IV Piggyback:200] Out: -  Intake/Output this shift: No intake/output data recorded.  PE: Heart: regular, few skipped beats Lungs: CTAB Abd: soft, NT, ND, +BS  Lab Results:  Recent Labs    02/07/18 0551 02/08/18 0428  WBC 14.5* 14.9*  HGB 12.7* 12.2*  HCT 37.2* 35.5*  PLT 114* 123*   BMET Recent Labs    02/07/18 0551 02/08/18 0428  NA 133* 132*  K 3.4* 3.8  CL 102 104  CO2 22 20*  GLUCOSE 119* 121*  BUN 11 9  CREATININE 0.92 0.80  CALCIUM 8.1* 7.8*   PT/INR No results for input(s): LABPROT, INR in the last 72 hours. CMP     Component Value Date/Time   NA 132 (L) 02/08/2018 0428   K 3.8 02/08/2018 0428   CL 104 02/08/2018 0428   CO2 20 (L) 02/08/2018 0428   GLUCOSE 121 (H) 02/08/2018 0428   BUN 9 02/08/2018 0428   CREATININE 0.80 02/08/2018 0428   CALCIUM 7.8 (L) 02/08/2018 0428   PROT 6.0 (L) 02/06/2018 0420   ALBUMIN 3.4 (L) 02/06/2018 0420   AST 25 02/06/2018 0420   ALT 15 (L) 02/06/2018 0420   ALKPHOS 49 02/06/2018 0420   BILITOT 1.1 02/06/2018 0420   GFRNONAA >60 02/08/2018 0428   GFRAA >60 02/08/2018 0428   Lipase     Component Value Date/Time   LIPASE 35 02/06/2018 0420       Studies/Results: No results found.  Anti-infectives: Anti-infectives (From admission, onward)   Start     Dose/Rate Route Frequency Ordered Stop   02/07/18 2000  ciprofloxacin (CIPRO) tablet 500 mg     500 mg Oral 2 times daily 02/07/18 1436     02/07/18 1500  metroNIDAZOLE (FLAGYL) tablet 500 mg     500 mg Oral Every 8 hours 02/07/18 1436     02/05/18 2200  metroNIDAZOLE (FLAGYL) IVPB 500 mg  Status:  Discontinued     500 mg 100 mL/hr over 60 Minutes Intravenous Every 8 hours 02/05/18 2033 02/07/18 1436   02/05/18 2100  ciprofloxacin (CIPRO) IVPB 400 mg  Status:  Discontinued     400 mg 200 mL/hr over 60 Minutes Intravenous Every 12 hours 02/05/18 2033 02/07/18 1436       Assessment/Plan Acalculous cholecystitis The patient has no abdominal pain today.  He is tolerating a regular diet.  WBC is stable around 14K.  No major fevers overnight. -surgically he is stable for DC home on 7 days of abx therapy. -given this is acalculous, if fully treated with abx therapy, his risk of recurrence is low as he does not have stones likely allowing him to avoid any surgery in the future.   Diastolic CHF Aortic regurg First degree AV block  FEN - heart healthy diet VTE - Lovenox ID - Cipro/Flagyl  LOS: 3 days    Henreitta Cea , [facility] 02/08/2018, 7:48 AM Pager: [phone]

## 2018-02-08 NOTE — Care Management Important Message (Signed)
Important Message  Patient Details  Name: Major Santerre MRN: [id number] Date of Birth: 1923/03/30   Medicare Important Message Given:  Yes    Kerin Salen 02/08/2018, 11:40 AMImportant Message  Patient Details  Name: Haruki Arnold MRN: [id number] Date of Birth: 02-Jun-1923   Medicare Important Message Given:  Yes    Kerin Salen 02/08/2018, 11:40 AM

## 2018-02-08 NOTE — Progress Notes (Signed)
Spoke with patient at bedside. Patient states he lives at home alone, still drives self, manages own meals, volunteers with [employer]. States he has a walker from previous fall but no longer uses it. He has been able to contact his daughter and she plans to bring him his house key and will provide him transportation home. Hopeful to d/c today. [phone]

## 2019-04-26 IMAGING — CT CT ABD-PELV W/ CM
2 of 5 series · 16 of 46 positions shown, 18 images · IV contrast (ISOVUE)
Comparison: None.

CLINICAL DATA: Upper to mid abdominal pain beginning this morning.
Abdominal distention.

EXAM:
CT ABDOMEN AND PELVIS WITH CONTRAST
TECHNIQUE: Multidetector CT imaging of the abdomen and pelvis was performed
using the standard protocol following bolus administration of
intravenous contrast.
CONTRAST:  100mL [id number] IOPAMIDOL ([id number]) INJECTION 61%

[Series 2: axial st · axial · 0.91mm/px · z∈[+975,+1380]mm · 13 of 95 slices shown, 15 images]
[im 7/95  soft-tissue]
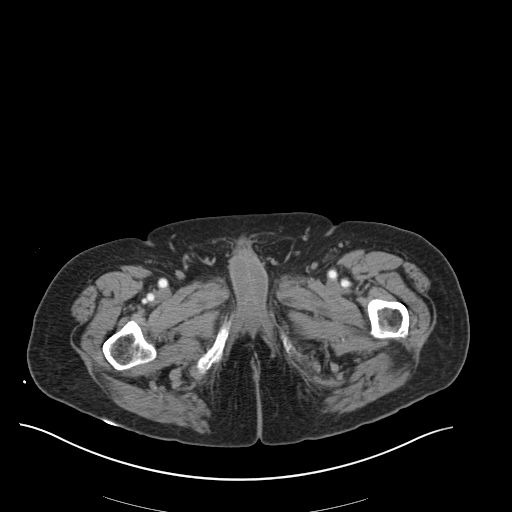
[im 7/95  bone]
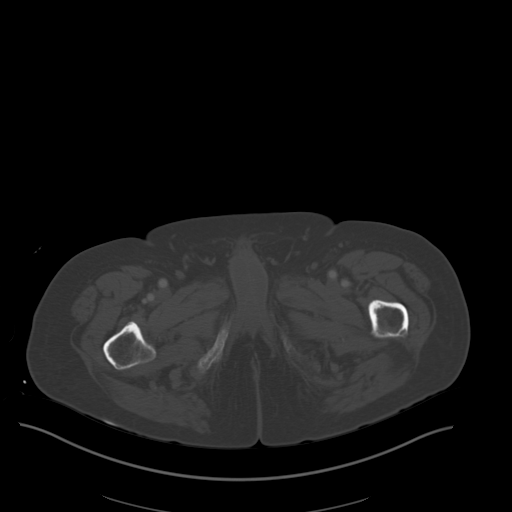
[im 13/95  soft-tissue]
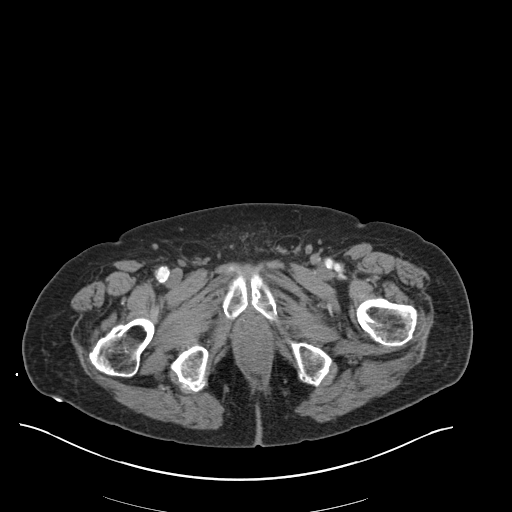
[im 19/95  soft-tissue]
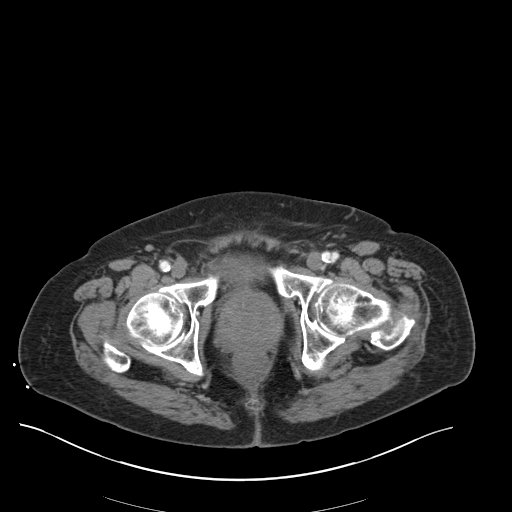
[im 26/95  soft-tissue]
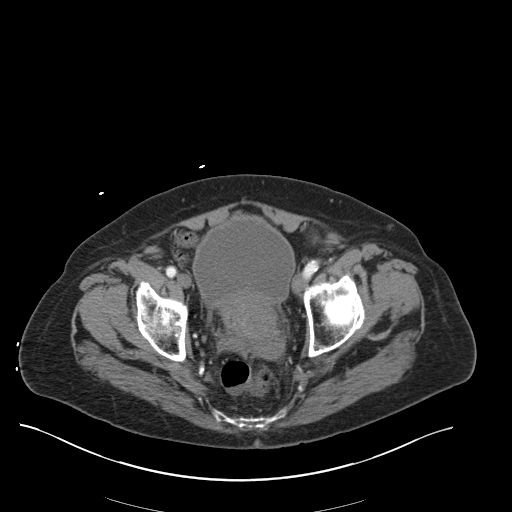
[im 32/95  soft-tissue]
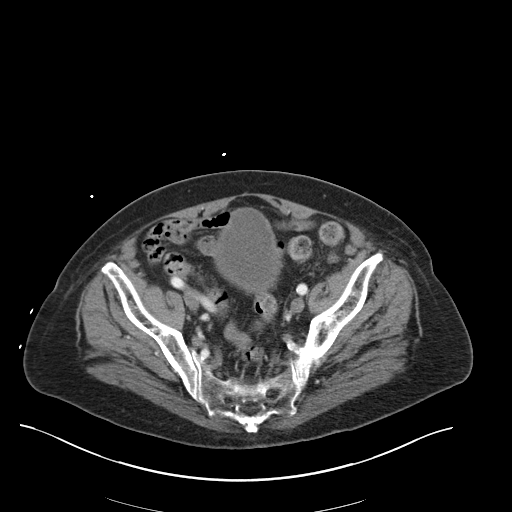
[im 38/95  soft-tissue]
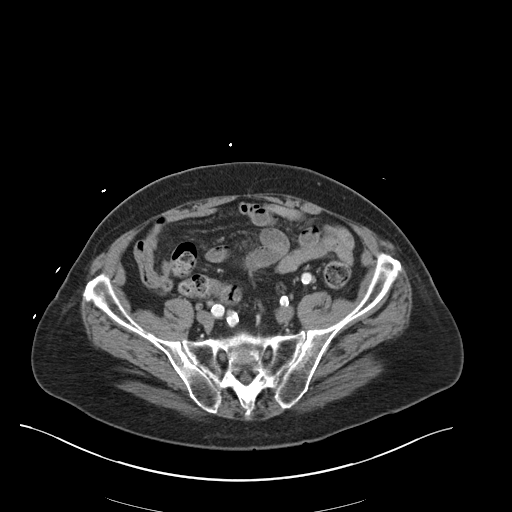
[im 51/95  soft-tissue]
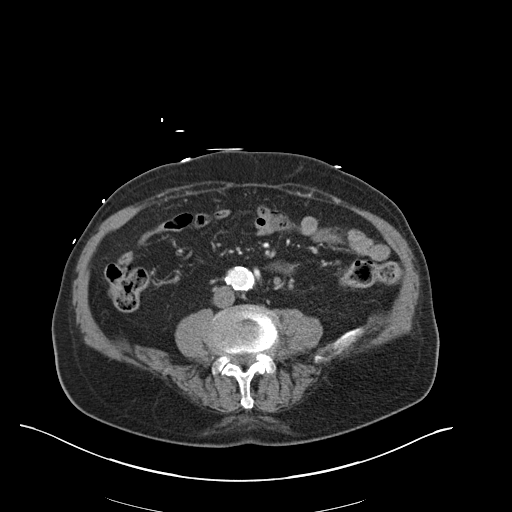
[im 57/95  soft-tissue]
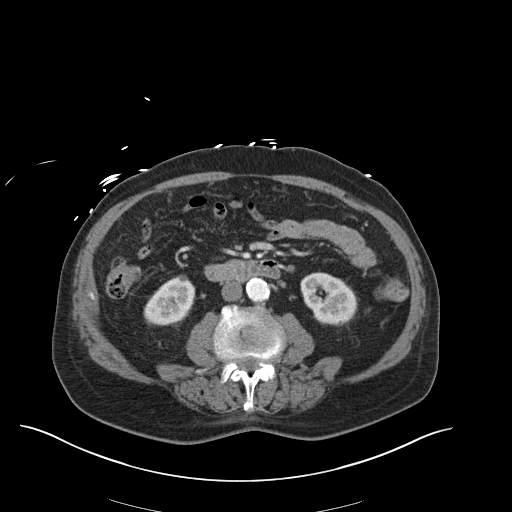
[im 63/95  soft-tissue]
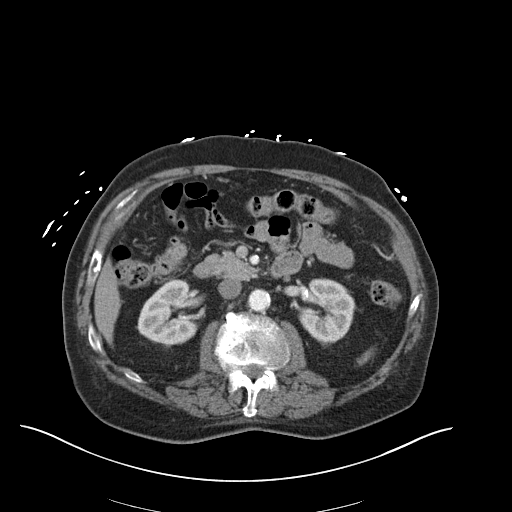
[im 63/95  bone]
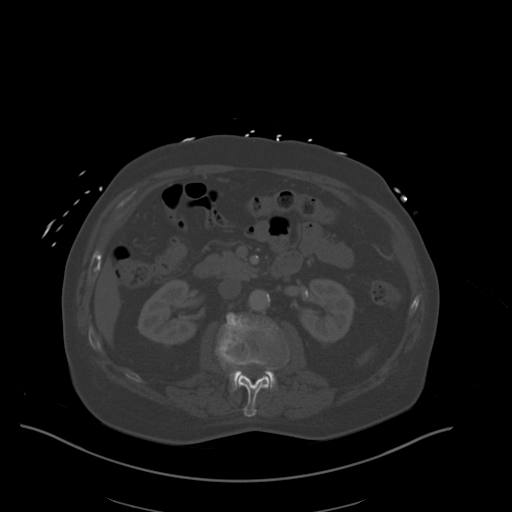
[im 69/95  soft-tissue]
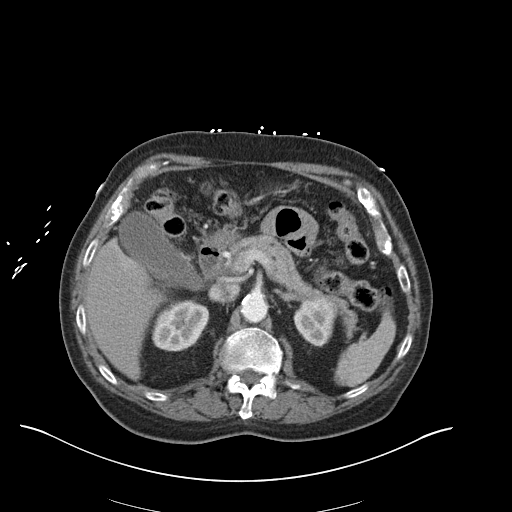
[im 76/95  soft-tissue]
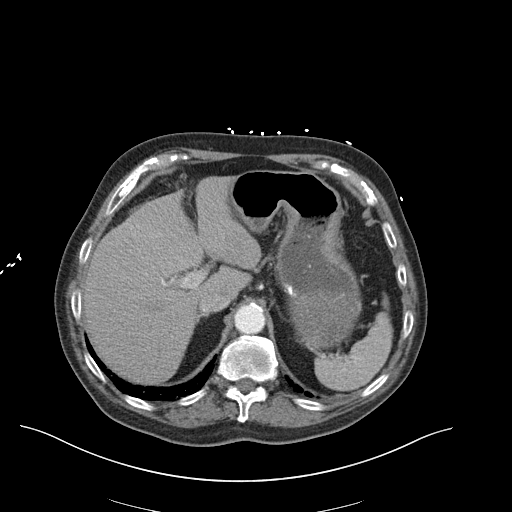
[im 82/95  soft-tissue]
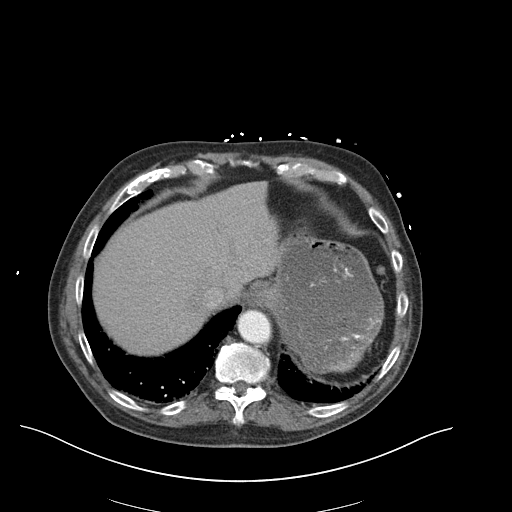
[im 88/95  soft-tissue]
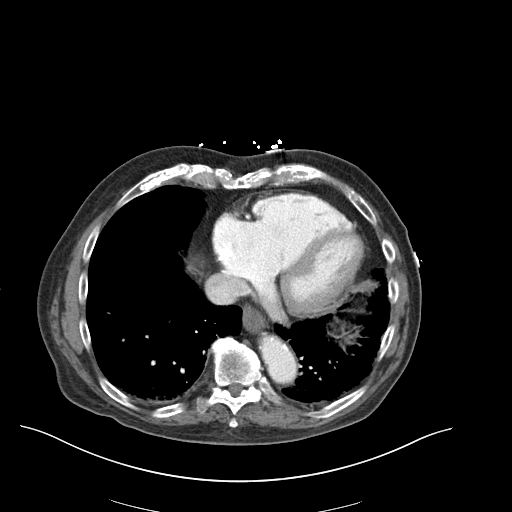

[Series 6: coronal st · coronal · 0.74mm/px · 3 of 99 slices shown]
[im 33/99  soft-tissue]
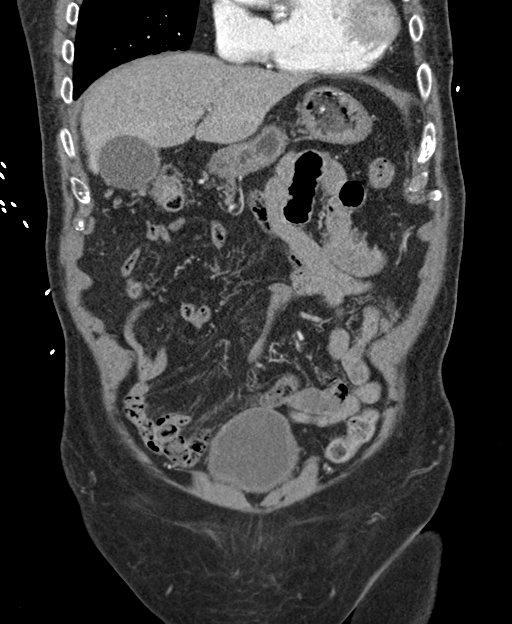
[im 44/99  soft-tissue]
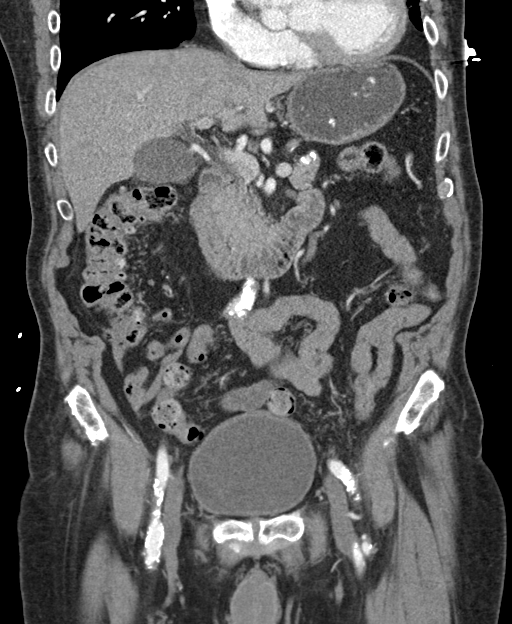
[im 55/99  soft-tissue]
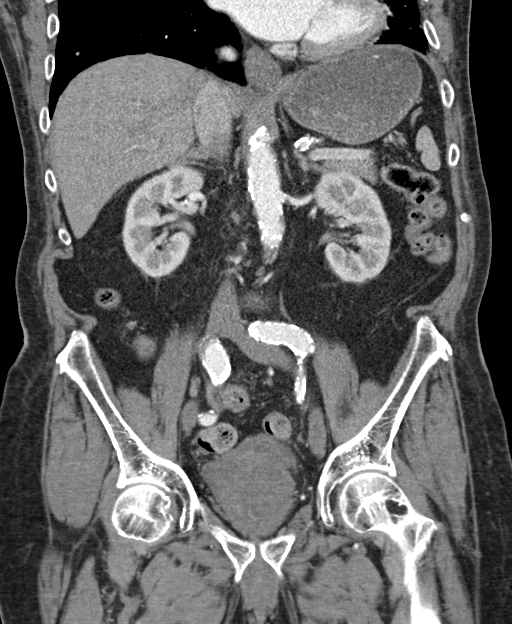

[16 of 46 positions shown; findings below may reference images not displayed]

FINDINGS: Lower chest: No acute abnormality.

Hepatobiliary: Liver is unremarkable. Gallbladder is distended.
Although the wall does appear thickened, there is mild hazy
inflammation in the fat adjacent to the lower gallbladder and
gallbladder neck. No discrete stone is seen. No bile duct dilation.

Pancreas: Unremarkable. No pancreatic ductal dilatation or
surrounding inflammatory changes.

Spleen: Normal in size without focal abnormality.

Adrenals/Urinary Tract: Kidneys are normal in position. Symmetric
renal enhancement and excretion. Small cyst arises from the lower
pole the right kidney. No other masses. No stones. No
hydronephrosis. Normal ureters. Bladder is unremarkable.

Stomach/Bowel: Stomach and small bowel are unremarkable. There are
diverticula scattered throughout the colon. No diverticulitis or
other colonic abnormality. Normal appendix visualized.

Vascular/Lymphatic: Dense aortic atherosclerotic calcifications. No
aneurysm. No enlarged lymph nodes.

Reproductive: Marked enlargement of the prostate gland, measuring 9
x 6.1 x 6.4 cm.

Other: No abdominal wall hernia or abnormality. No abdominopelvic
ascites.

Musculoskeletal: Mild compression fracture of L1, most likely
chronic. No other fractures. No osteoblastic or osteolytic lesions.
IMPRESSION: 1. Mild hazy inflammation adjacent to the gallbladder. Suspect early
acute cholecystitis if this correlates clinically. Consider
follow-up right upper quadrant ultrasound to assess for gallstones
not visualized by CT.
2. No other evidence of an acute abnormality.
3. Marked prostatic hypertrophy.
4. Aortic atherosclerosis.
5. Scattered colonic diverticula without diverticulitis.

## 2019-04-26 IMAGING — US US ABDOMEN LIMITED
1 series · 14 of 25 positions shown · non-contrast
Comparison: CT scan February 05, 2017

CLINICAL DATA: Right upper quadrant pain.

EXAM:
ULTRASOUND ABDOMEN LIMITED RIGHT UPPER QUADRANT

[Series 1: us abdomen limited · 14 of 74 slices shown]
[im 1/74]
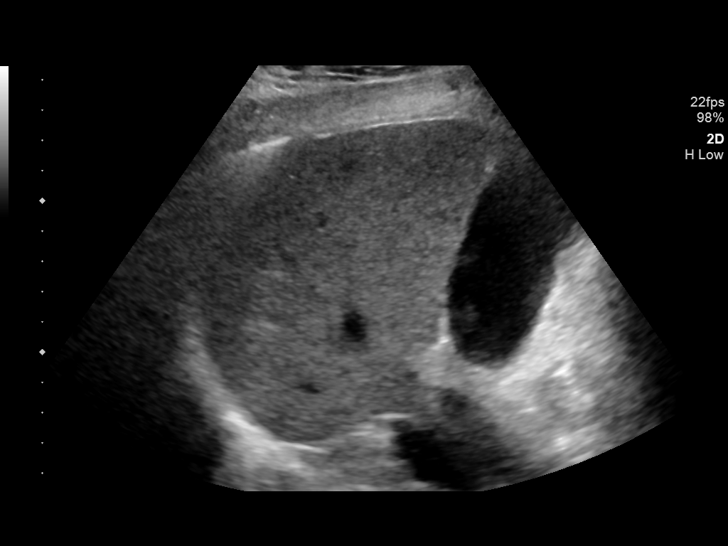
[im 7/74]
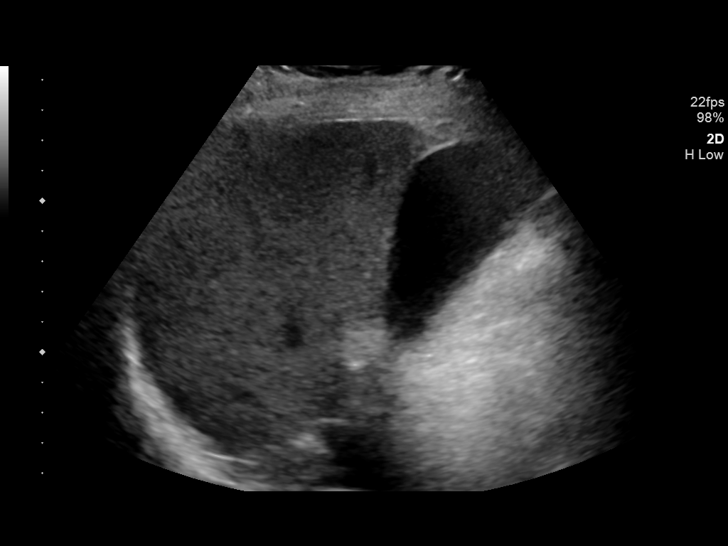
[im 13/74]
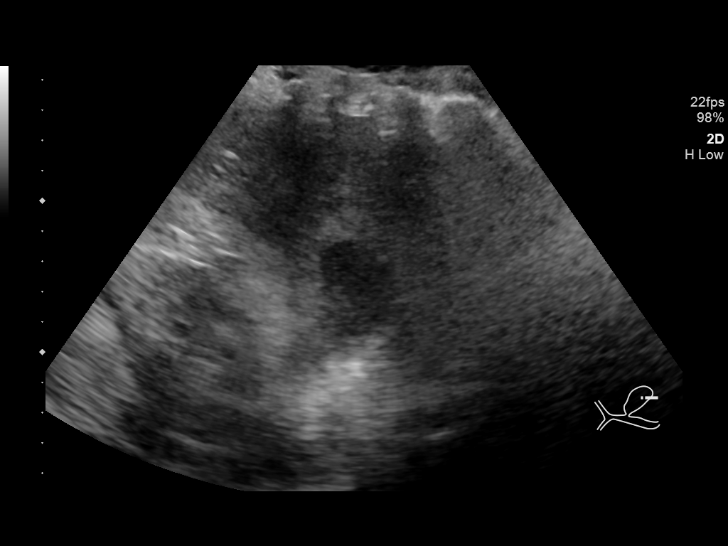
[im 19/74]
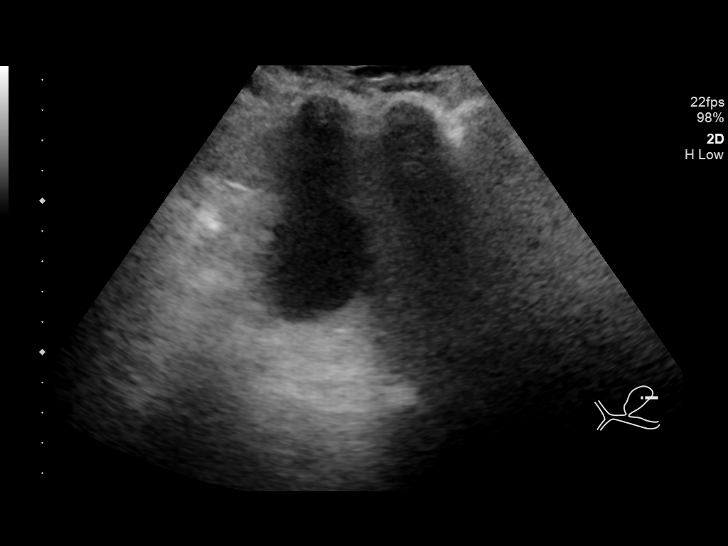
[im 25/74]
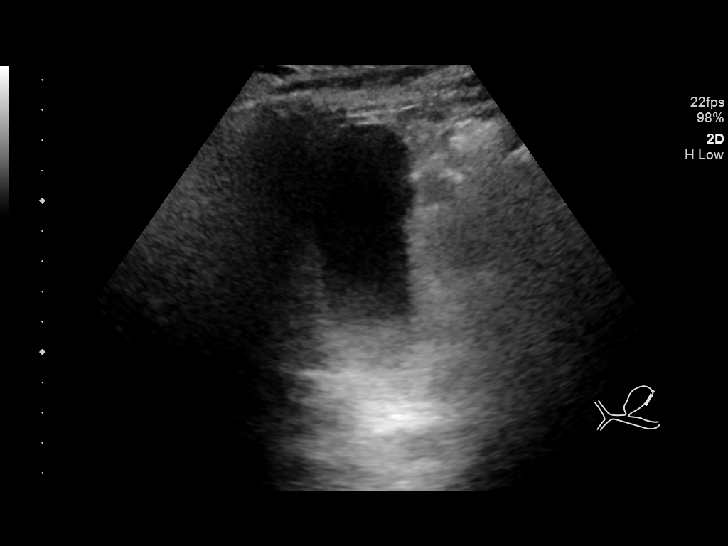
[im 28/74]
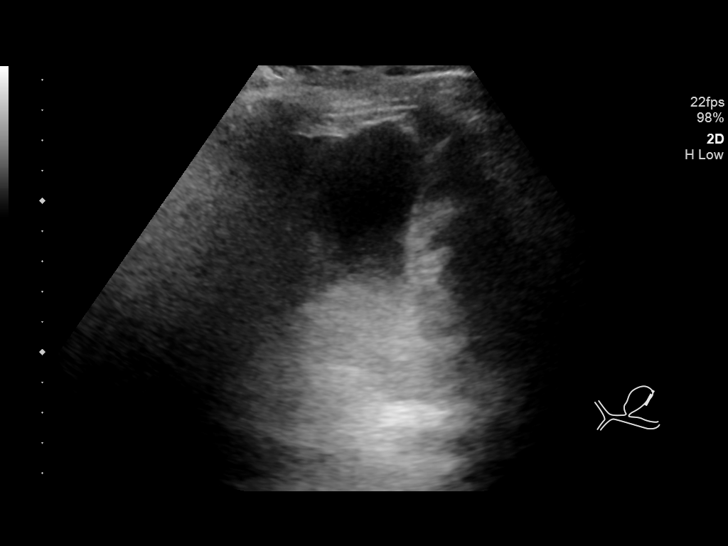
[im 34/74]
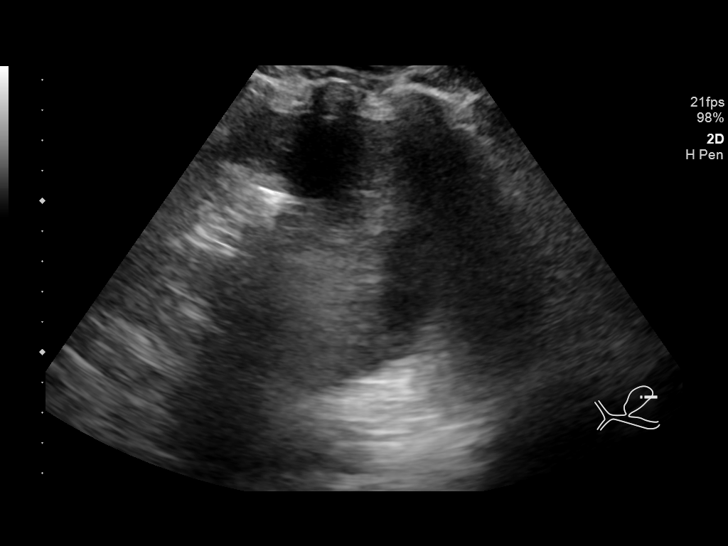
[im 40/74]
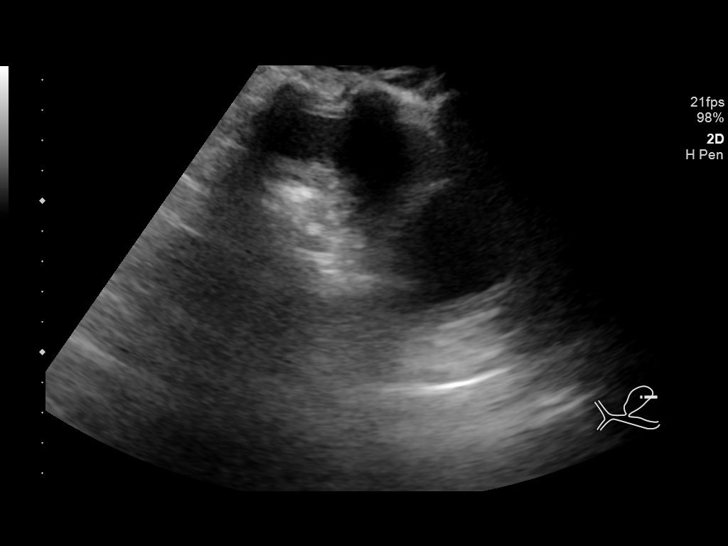
[im 46/74]
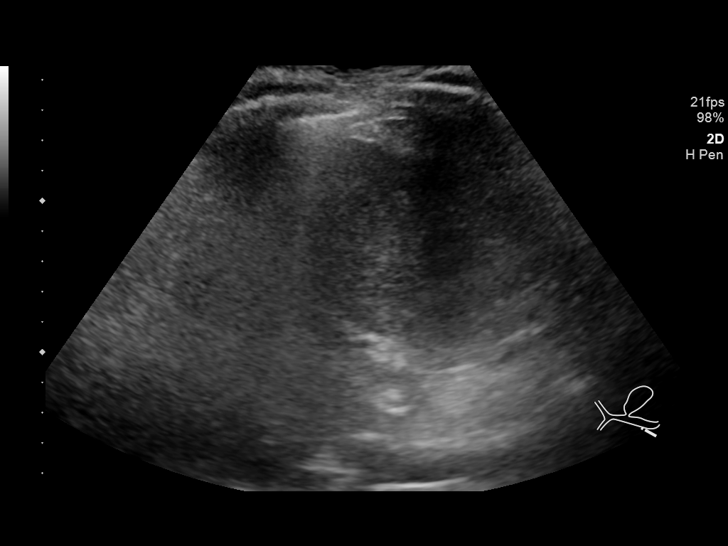
[im 49/74]
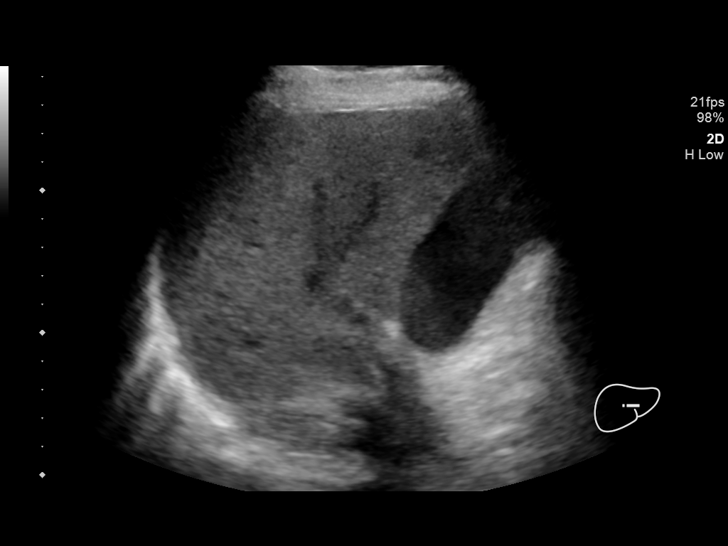
[im 55/74]
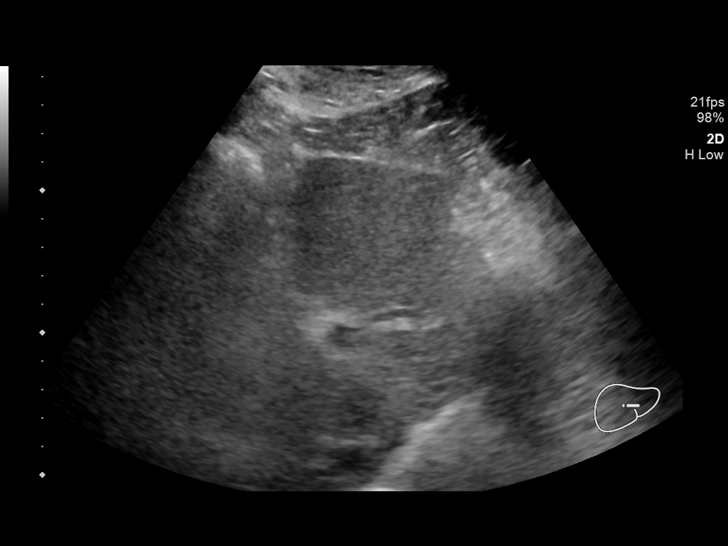
[im 61/74]
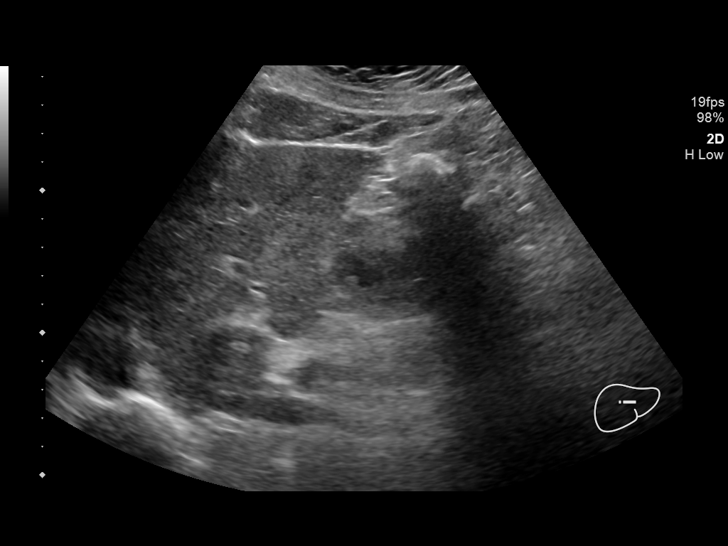
[im 67/74]
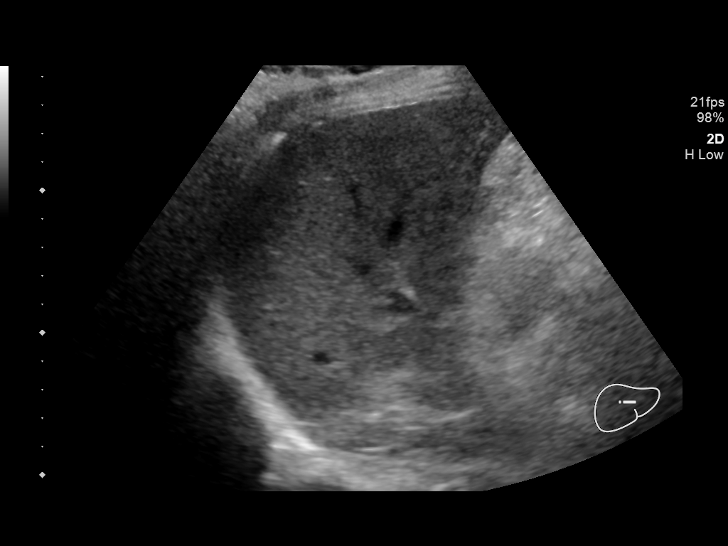
[im 74/74]
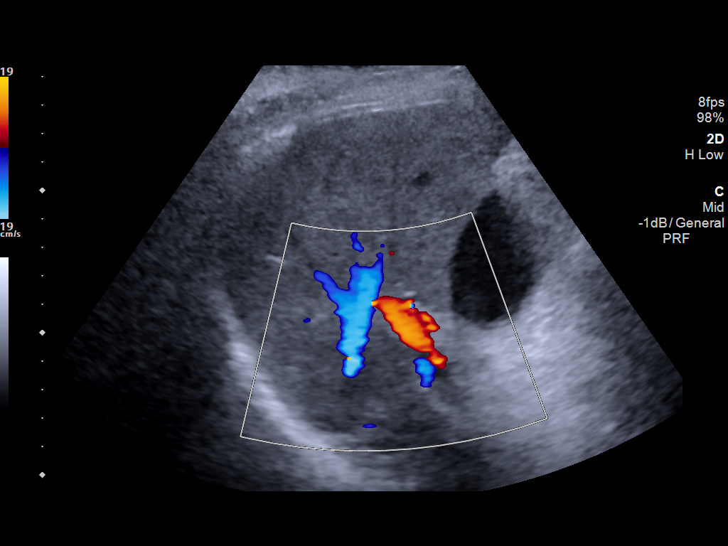

[14 of 25 positions shown; findings below may reference images not displayed]

FINDINGS: Gallbladder:

There is sludge in the gallbladder with no stones, wall thickening,
pericholecystic fluid, or Murphy's sign.

Common bile duct:

Diameter: 3.6 mm

Liver:

Hepatic steatosis. No focal mass. Portal vein is patent on color
Doppler imaging with normal direction of blood flow towards the
liver.
IMPRESSION: 1. There is sludge in the gallbladder with no wall thickening,
pericholecystic fluid, stones, or Murphy's sign. A HIDA scan could
further evaluate if clinical concern persists.
2. Hepatic steatosis.

## 2019-04-26 IMAGING — NM NM HEPATOBILIARY IMAGE, INC GB
1 series · 12 of 12 positions shown · non-contrast
Comparison: CT abdomen and pelvis and right upper quadrant
ultrasound from same day.

CLINICAL DATA: Abdominal pain.  Evaluate for acute cholecystitis.

EXAM:
NUCLEAR MEDICINE HEPATOBILIARY IMAGING
TECHNIQUE: Sequential images of the abdomen were obtained [DATE] minutes
following intravenous administration of radiopharmaceutical.
RADIOPHARMACEUTICALS:  5.45 mCi Cc-22m  Choletec IV

[Series 1: hepato · 4.46mm/px · 2 acquisitions, 12 frames shown]
[im 1/2]
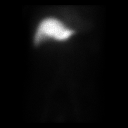
[im 1/2]
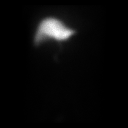
[im 1/2]
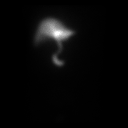
[im 1/2]
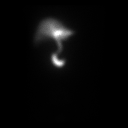
[im 1/2]
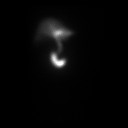
[im 1/2]
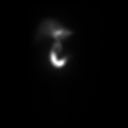
[im 2/2]
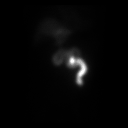
[im 2/2]
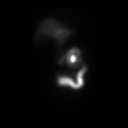
[im 2/2]
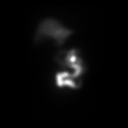
[im 2/2]
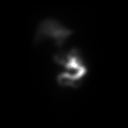
[im 2/2]
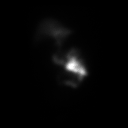
[im 2/2]
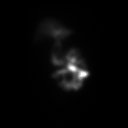

[12 of 12 positions shown; findings below may reference images not displayed]

FINDINGS: Prompt uptake and biliary excretion of activity by the liver is
seen. Gallbladder activity is not visualized. Biliary activity
passes into small bowel, consistent with patent common bile duct.
IMPRESSION: 1. Nonvisualization of the gallbladder, consistent with acute
cholecystitis.

## 2019-11-05 ENCOUNTER — Ambulatory Visit: Payer: Medicare Other

## 2020-11-14 DIAGNOSIS — H401111 Primary open-angle glaucoma, right eye, mild stage: Secondary | ICD-10-CM | POA: Diagnosis not present

## 2020-11-14 DIAGNOSIS — H401123 Primary open-angle glaucoma, left eye, severe stage: Secondary | ICD-10-CM | POA: Diagnosis not present

## 2021-03-06 DIAGNOSIS — B353 Tinea pedis: Secondary | ICD-10-CM | POA: Diagnosis not present

## 2021-03-06 DIAGNOSIS — L578 Other skin changes due to chronic exposure to nonionizing radiation: Secondary | ICD-10-CM | POA: Diagnosis not present

## 2021-03-06 DIAGNOSIS — L570 Actinic keratosis: Secondary | ICD-10-CM | POA: Diagnosis not present

## 2021-03-06 DIAGNOSIS — D485 Neoplasm of uncertain behavior of skin: Secondary | ICD-10-CM | POA: Diagnosis not present

## 2021-03-06 DIAGNOSIS — L814 Other melanin hyperpigmentation: Secondary | ICD-10-CM | POA: Diagnosis not present

## 2021-03-06 DIAGNOSIS — L821 Other seborrheic keratosis: Secondary | ICD-10-CM | POA: Diagnosis not present

## 2021-03-11 DIAGNOSIS — H401111 Primary open-angle glaucoma, right eye, mild stage: Secondary | ICD-10-CM | POA: Diagnosis not present

## 2021-03-11 DIAGNOSIS — H1132 Conjunctival hemorrhage, left eye: Secondary | ICD-10-CM | POA: Diagnosis not present

## 2021-03-11 DIAGNOSIS — H401123 Primary open-angle glaucoma, left eye, severe stage: Secondary | ICD-10-CM | POA: Diagnosis not present

## 2021-06-07 DIAGNOSIS — Z0000 Encounter for general adult medical examination without abnormal findings: Secondary | ICD-10-CM | POA: Diagnosis not present

## 2021-06-07 DIAGNOSIS — R2681 Unsteadiness on feet: Secondary | ICD-10-CM | POA: Diagnosis not present

## 2021-06-07 DIAGNOSIS — Z23 Encounter for immunization: Secondary | ICD-10-CM | POA: Diagnosis not present

## 2021-06-19 DIAGNOSIS — H401111 Primary open-angle glaucoma, right eye, mild stage: Secondary | ICD-10-CM | POA: Diagnosis not present

## 2021-06-19 DIAGNOSIS — H401123 Primary open-angle glaucoma, left eye, severe stage: Secondary | ICD-10-CM | POA: Diagnosis not present

## 2021-06-24 DIAGNOSIS — R2681 Unsteadiness on feet: Secondary | ICD-10-CM | POA: Diagnosis not present

## 2021-06-27 DIAGNOSIS — R2681 Unsteadiness on feet: Secondary | ICD-10-CM | POA: Diagnosis not present

## 2021-07-01 DIAGNOSIS — R2681 Unsteadiness on feet: Secondary | ICD-10-CM | POA: Diagnosis not present

## 2021-07-03 DIAGNOSIS — R2681 Unsteadiness on feet: Secondary | ICD-10-CM | POA: Diagnosis not present

## 2021-07-08 DIAGNOSIS — R2681 Unsteadiness on feet: Secondary | ICD-10-CM | POA: Diagnosis not present

## 2021-07-16 DIAGNOSIS — R2681 Unsteadiness on feet: Secondary | ICD-10-CM | POA: Diagnosis not present

## 2021-07-18 DIAGNOSIS — R2681 Unsteadiness on feet: Secondary | ICD-10-CM | POA: Diagnosis not present

## 2021-07-22 DIAGNOSIS — R079 Chest pain, unspecified: Secondary | ICD-10-CM | POA: Diagnosis not present

## 2021-07-22 DIAGNOSIS — R2681 Unsteadiness on feet: Secondary | ICD-10-CM | POA: Diagnosis not present

## 2021-07-22 DIAGNOSIS — R109 Unspecified abdominal pain: Secondary | ICD-10-CM | POA: Diagnosis not present

## 2021-07-23 DIAGNOSIS — R6889 Other general symptoms and signs: Secondary | ICD-10-CM | POA: Diagnosis not present

## 2021-07-26 DIAGNOSIS — R2681 Unsteadiness on feet: Secondary | ICD-10-CM | POA: Diagnosis not present

## 2021-07-29 DIAGNOSIS — R2681 Unsteadiness on feet: Secondary | ICD-10-CM | POA: Diagnosis not present

## 2021-08-01 DIAGNOSIS — R2681 Unsteadiness on feet: Secondary | ICD-10-CM | POA: Diagnosis not present

## 2021-08-08 DIAGNOSIS — R6889 Other general symptoms and signs: Secondary | ICD-10-CM | POA: Diagnosis not present

## 2021-08-13 DIAGNOSIS — R2681 Unsteadiness on feet: Secondary | ICD-10-CM | POA: Diagnosis not present

## 2021-08-21 DIAGNOSIS — R2681 Unsteadiness on feet: Secondary | ICD-10-CM | POA: Diagnosis not present

## 2021-08-23 DIAGNOSIS — R2681 Unsteadiness on feet: Secondary | ICD-10-CM | POA: Diagnosis not present

## 2021-09-02 DIAGNOSIS — R2681 Unsteadiness on feet: Secondary | ICD-10-CM | POA: Diagnosis not present

## 2021-09-05 DIAGNOSIS — R2681 Unsteadiness on feet: Secondary | ICD-10-CM | POA: Diagnosis not present

## 2021-09-11 DIAGNOSIS — R2681 Unsteadiness on feet: Secondary | ICD-10-CM | POA: Diagnosis not present

## 2021-09-25 DIAGNOSIS — R2681 Unsteadiness on feet: Secondary | ICD-10-CM | POA: Diagnosis not present

## 2021-10-02 DIAGNOSIS — R2681 Unsteadiness on feet: Secondary | ICD-10-CM | POA: Diagnosis not present

## 2021-10-09 DIAGNOSIS — R2681 Unsteadiness on feet: Secondary | ICD-10-CM | POA: Diagnosis not present

## 2021-10-16 DIAGNOSIS — R2681 Unsteadiness on feet: Secondary | ICD-10-CM | POA: Diagnosis not present

## 2021-10-23 DIAGNOSIS — R030 Elevated blood-pressure reading, without diagnosis of hypertension: Secondary | ICD-10-CM | POA: Diagnosis not present

## 2021-10-23 DIAGNOSIS — R2681 Unsteadiness on feet: Secondary | ICD-10-CM | POA: Diagnosis not present

## 2021-11-25 DIAGNOSIS — H401123 Primary open-angle glaucoma, left eye, severe stage: Secondary | ICD-10-CM | POA: Diagnosis not present

## 2021-11-25 DIAGNOSIS — H401111 Primary open-angle glaucoma, right eye, mild stage: Secondary | ICD-10-CM | POA: Diagnosis not present

## 2022-01-22 ENCOUNTER — Ambulatory Visit: Payer: Medicare Other | Admitting: Cardiology

## 2022-01-27 ENCOUNTER — Ambulatory Visit: Payer: Medicare Other | Admitting: Cardiology

## 2022-02-10 ENCOUNTER — Ambulatory Visit: Payer: Medicare Other | Admitting: Cardiology

## 2022-02-10 ENCOUNTER — Encounter: Payer: Self-pay | Admitting: Cardiology

## 2022-02-10 VITALS — BP 130/72 | HR 77 | Temp 97.2°F | Resp 16 | Ht 69.0 in | Wt 168.8 lb

## 2022-02-10 DIAGNOSIS — R9431 Abnormal electrocardiogram [ECG] [EKG]: Secondary | ICD-10-CM

## 2022-02-10 NOTE — Progress Notes (Signed)
Primary Physician/Referring:  Orpah Melter, MD  Patient ID: Jeremy Marks, male    DOB: 03/07/1923, 88 y.o.   MRN: [id number]  No chief complaint on file.  HPI:    Jeremy Marks  is a 88 y.o. 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  He is presently completely asymptomatic.    History reviewed. No pertinent past medical history. Past Surgical History:  Procedure Laterality Date   TONSILLECTOMY  1938   Family History  Problem Relation Age of Onset   Congestive Heart Failure Mother    Miscarriages / [place] Brother     Social History   Tobacco Use   Smoking status: Former    Packs/day: 0.25    Years: 15.00    Pack years: 3.75    Types: Cigarettes    Start date: 09/22/1954    Quit date: 09/22/1958    Years since quitting: 63.4   Smokeless tobacco: Never  Substance Use Topics   Alcohol use: Yes    Alcohol/week: 1.0 standard drink    Types: 1 Glasses of wine per week    Comment: occasionally   Marital Status: Widowed  ROS  Review of Systems  Cardiovascular:  Negative for chest pain, dyspnea on exertion and leg swelling.  Neurological:  Positive for disturbances in coordination.  Objective  Blood pressure 130/72, pulse 77, temperature (!) 97.2 F (36.2 C), temperature source Temporal, resp. rate 16, height '5\' 9"'$  (1.753 m), weight 168 lb 12.8 oz (76.6 kg), SpO2 95 %. Body mass index is 24.93 kg/m.     02/10/2022    1:18 PM 02/08/2018    5:02 AM 02/07/2018    8:39 PM  Vitals with BMI  Height '5\' 9"'$      Weight 168 lbs 13 oz    BMI 85.63    Systolic 149 702 637  Diastolic 72 67 49  Pulse 77 76 76    Physical Exam Neck:     Vascular: No JVD.  Cardiovascular:     Rate and Rhythm: Normal rate and regular rhythm.     Pulses: Intact distal pulses.     Heart sounds: Murmur heard.  Early diastolic murmur is present with a grade of 2/4 at the upper right sternal border.    No gallop.  Pulmonary:     Effort: Pulmonary effort is normal.     Breath sounds: Normal breath sounds.  Abdominal:     General: Bowel sounds are normal.     Palpations: Abdomen is soft.  Musculoskeletal:     Right lower leg: No edema.     Left lower leg: No edema.    Medications and allergies   Allergies  Allergen Reactions   Penicillins Anaphylaxis     Medication list after today's encounter   Current Outpatient Medications:    bisacodyl (DULCOLAX) 5 MG EC tablet, Take 5 mg by mouth daily as needed for moderate constipation., Disp: , Rfl:    bismuth subsalicylate (PEPTO BISMOL) 262 MG/15ML suspension, Take 30 mLs by mouth every 6 (six) hours as needed for indigestion., Disp: ,  Rfl:    brimonidine (ALPHAGAN) 0.2 % ophthalmic solution, 1 drop 3 (three) times daily., Disp: , Rfl:    dorzolamide-timolol (COSOPT) 22.3-6.8 MG/ML ophthalmic solution, Place 1 drop into both eyes 2 (two) times daily., Disp: , Rfl:    latanoprost (XALATAN) 0.005 % ophthalmic solution, Place 1 drop into both eyes at bedtime., Disp: , Rfl:    triamcinolone cream (KENALOG) 0.1 %, Apply 1 application. topically 2 (two) times daily., Disp: , Rfl:   Laboratory examination:   External labs:   Labs 02/02/2020:  Total cholesterol 151, triglycerides 97, HDL 39, LDL 94.  Radiology:    Cardiac Studies:   Echocardiogram 02/06/2018: Normal LV size, severe basal septal hypertrophy, normal LV systolic function, EF 55 to 60%.  No regional wall motion abnormality.  Grade 1 diastolic dysfunction. Aortic valve is trileaflet, moderately  thickened, moderately calcified.  Mild aortic regurgitation. Mitral valve mildly calcified annulus.  Trace mit ral regurgitation.  EKG:   EKG 02/10/2022: Sinus rhythm with marked first-degree AV block at the rate of 63 bpm, PR interval 300 ms.  Left axis deviation, left anterior fascicular block.  IVCD, LVH.  Poor R wave progression, cannot exclude extensive anterolateral infarct old.  Consider atypical LBBB.  Compared to 01-Aug-2021, IVCD much more pronounced.  Compared to December 14, 2017, incomplete left bundle branch block is new.    EKG August 01, 2021: Sinus rhythm with first-degree AV block at rate of 53 bpm, left axis deviation, left anterior fascicular block.  IVCD, borderline criteria for LVH.  Poor R wave progression, cannot exclude extensive anterolateral infarct old.  Assessment     ICD-10-CM   1. Abnormal EKG  R94.31 EKG 12-Lead       Medications Discontinued During This Encounter  Medication Reason   acidophilus (RISAQUAD) CAPS capsule     No orders of the defined types were placed in this encounter.  Orders Placed This Encounter  Procedures   EKG 12-Lead   Recommendations:   Jeremy Marks is a 88 y.o. 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  He is presently completely asymptomatic.  His physical examination except for mild aortic regurgitation murmur is completely normal.  I reviewed his echocardiogram that was performed in 12/14/2017 when he had very mild aortic regurgitation with normal LV systolic function.  As he remains  asymptomatic, murmur is very soft and inconsequential, did not order repeat echocardiogram.  With regard to abnormal EKG, he clearly has conduction system disease, since 2017-12-14 when the QRS complex was narrow, now he has incomplete left bundle branch block.  The EKG however was fairly similar to PCP EKG done on 08-01-21.  As again patient is asymptomatic, no symptoms of dizziness or syncope, no decreased exercise tolerance, would not recommend further evaluation.  He has bifascicular block with marked first-degree AV block and left anterior fascicular block.  He is clearly at risk for developing either complete heart block or significant bradycardia that may need pacemaker implantation at some point if he becomes symptomatic.  In view of his advanced age, abnormal EKG, I would like to see him back in  6 months for close monitoring.  It was a pleasure to meet Mr. Jeremy Marks.    Adrian Prows, MD, [facility] 02/10/2022, 2:08 PM Office: [phone]

## 2022-03-12 DIAGNOSIS — L578 Other skin changes due to chronic exposure to nonionizing radiation: Secondary | ICD-10-CM | POA: Diagnosis not present

## 2022-03-12 DIAGNOSIS — L821 Other seborrheic keratosis: Secondary | ICD-10-CM | POA: Diagnosis not present

## 2022-03-20 DIAGNOSIS — R2681 Unsteadiness on feet: Secondary | ICD-10-CM | POA: Diagnosis not present

## 2022-03-28 DIAGNOSIS — R2681 Unsteadiness on feet: Secondary | ICD-10-CM | POA: Diagnosis not present

## 2022-04-02 DIAGNOSIS — R2681 Unsteadiness on feet: Secondary | ICD-10-CM | POA: Diagnosis not present

## 2022-04-08 DIAGNOSIS — R2681 Unsteadiness on feet: Secondary | ICD-10-CM | POA: Diagnosis not present

## 2022-04-23 DIAGNOSIS — L304 Erythema intertrigo: Secondary | ICD-10-CM | POA: Diagnosis not present

## 2022-04-28 DIAGNOSIS — R2681 Unsteadiness on feet: Secondary | ICD-10-CM | POA: Diagnosis not present

## 2022-05-06 DIAGNOSIS — R2681 Unsteadiness on feet: Secondary | ICD-10-CM | POA: Diagnosis not present

## 2022-05-19 DIAGNOSIS — R2681 Unsteadiness on feet: Secondary | ICD-10-CM | POA: Diagnosis not present

## 2022-05-28 DIAGNOSIS — R2681 Unsteadiness on feet: Secondary | ICD-10-CM | POA: Diagnosis not present

## 2022-06-05 DIAGNOSIS — R2681 Unsteadiness on feet: Secondary | ICD-10-CM | POA: Diagnosis not present

## 2022-06-11 DIAGNOSIS — Z0000 Encounter for general adult medical examination without abnormal findings: Secondary | ICD-10-CM | POA: Diagnosis not present

## 2022-06-11 DIAGNOSIS — R2681 Unsteadiness on feet: Secondary | ICD-10-CM | POA: Diagnosis not present

## 2022-06-11 DIAGNOSIS — Z23 Encounter for immunization: Secondary | ICD-10-CM | POA: Diagnosis not present

## 2022-06-11 DIAGNOSIS — Z131 Encounter for screening for diabetes mellitus: Secondary | ICD-10-CM | POA: Diagnosis not present

## 2022-06-11 DIAGNOSIS — Z136 Encounter for screening for cardiovascular disorders: Secondary | ICD-10-CM | POA: Diagnosis not present

## 2022-06-17 DIAGNOSIS — R2681 Unsteadiness on feet: Secondary | ICD-10-CM | POA: Diagnosis not present

## 2022-06-24 DIAGNOSIS — R2681 Unsteadiness on feet: Secondary | ICD-10-CM | POA: Diagnosis not present

## 2022-07-01 DIAGNOSIS — R2681 Unsteadiness on feet: Secondary | ICD-10-CM | POA: Diagnosis not present

## 2022-07-08 DIAGNOSIS — R2681 Unsteadiness on feet: Secondary | ICD-10-CM | POA: Diagnosis not present

## 2022-07-15 DIAGNOSIS — R2681 Unsteadiness on feet: Secondary | ICD-10-CM | POA: Diagnosis not present

## 2022-07-22 DIAGNOSIS — R2681 Unsteadiness on feet: Secondary | ICD-10-CM | POA: Diagnosis not present

## 2022-07-30 DIAGNOSIS — R2681 Unsteadiness on feet: Secondary | ICD-10-CM | POA: Diagnosis not present

## 2022-08-06 DIAGNOSIS — R2681 Unsteadiness on feet: Secondary | ICD-10-CM | POA: Diagnosis not present

## 2022-08-11 DIAGNOSIS — H401111 Primary open-angle glaucoma, right eye, mild stage: Secondary | ICD-10-CM | POA: Diagnosis not present

## 2022-08-11 DIAGNOSIS — H401123 Primary open-angle glaucoma, left eye, severe stage: Secondary | ICD-10-CM | POA: Diagnosis not present

## 2022-08-13 DIAGNOSIS — R2681 Unsteadiness on feet: Secondary | ICD-10-CM | POA: Diagnosis not present

## 2022-08-18 ENCOUNTER — Ambulatory Visit: Payer: Medicare Other | Admitting: Cardiology

## 2022-08-18 ENCOUNTER — Encounter: Payer: Self-pay | Admitting: Cardiology

## 2022-08-18 VITALS — BP 135/61 | HR 82 | Resp 16 | Ht 69.0 in | Wt 169.0 lb

## 2022-08-18 DIAGNOSIS — I351 Nonrheumatic aortic (valve) insufficiency: Secondary | ICD-10-CM

## 2022-08-18 DIAGNOSIS — R9431 Abnormal electrocardiogram [ECG] [EKG]: Secondary | ICD-10-CM

## 2022-08-18 NOTE — Progress Notes (Signed)
Primary Physician/Referring:  Orpah Melter, MD  Patient ID: Jeremy Marks, male    DOB: 1923/08/01, 88 y.o.   MRN: [id number]  Chief Complaint  Patient presents with   Abnormal ECG   Heart block   Follow-up    6 months   HPI:    Jeremy Marks  is a 88 y.o. 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  He presents for a 88-monthoffice visit and follow-up of abnormal EKG.  He is presently completely asymptomatic.    History reviewed. No pertinent past medical history. Past Surgical History:  Procedure Laterality Date   TONSILLECTOMY  1938   Family History  Problem Relation Age of Onset   Congestive Heart Failure Mother    Miscarriages / [place]     Social History   Tobacco Use   Smoking status: Former    Packs/day: 0.25    Years: 15.00    Total pack years: 3.75    Types: Cigarettes    Start date: 09/22/1954    Quit date: 09/22/1958    Years since quitting: 63.9   Smokeless tobacco: Never  Substance Use Topics   Alcohol use: Yes    Alcohol/week: 1.0 standard drink of alcohol    Types: 1 Glasses of wine per week    Comment: occasionally   Marital Status: Widowed  ROS  Review of Systems  Cardiovascular:  Negative for chest pain, dyspnea on exertion and leg swelling.  Neurological:  Positive for disturbances in coordination.   Objective  Blood pressure 135/61, pulse 82, resp. rate 16, height '5\' 9"'$  (1.753 m), weight 169 lb (76.7 kg), SpO2 94 %. Body mass index is 24.96 kg/m.     08/18/2022    1:35 PM 02/10/2022    1:18 PM 02/08/2018    5:02 AM  Vitals with BMI  Height '5\' 9"'$  '5\' 9"'$    Weight 169 lbs 168 lbs 13 oz   BMI 256.21230.86  Systolic [id number] Diastolic 61 72 67  Pulse 82 77 76    Physical Exam Neck:     Vascular: No JVD.   Cardiovascular:     Rate and Rhythm: Normal rate and regular rhythm. Extrasystoles are present.    Pulses: Intact distal pulses.     Heart sounds: Murmur heard.     Early diastolic murmur is present with a grade of 2/4 at the upper right sternal border.     No gallop.  Pulmonary:     Effort: Pulmonary effort is normal.     Breath sounds: Normal breath sounds.  Abdominal:     General: Bowel sounds are normal.     Palpations: Abdomen is soft.  Musculoskeletal:     Right lower leg: No edema.     Left lower leg: No edema.    Medications and allergies   Allergies  Allergen Reactions   Penicillins Anaphylaxis    Medication list after today's encounter   Current Outpatient Medications:    bisacodyl (DULCOLAX) 5 MG EC tablet, Take 5 mg by mouth daily as needed for moderate constipation., Disp: , Rfl:    brimonidine (ALPHAGAN) 0.2 % ophthalmic solution, 1 drop 3 (three) times daily., Disp: , Rfl:    dorzolamide-timolol (COSOPT) 22.3-6.8 MG/ML ophthalmic solution, Place 1 drop into both eyes 2 (  two) times daily., Disp: , Rfl:    Lactobacillus (PROBIOTIC ACIDOPHILUS PO), Take by mouth daily., Disp: , Rfl:    latanoprost (XALATAN) 0.005 % ophthalmic solution, Place 1 drop into both eyes at bedtime., Disp: , Rfl:   Laboratory examination:   External labs:   Labs 02/02/2020:  Total cholesterol 151, triglycerides 97, HDL 39, LDL 94.  Radiology:    Cardiac Studies:   Echocardiogram 02/06/2018: Normal LV size, severe basal septal hypertrophy, normal LV systolic function, EF 55 to 60%.  No regional wall motion abnormality.  Grade 1 diastolic dysfunction. Aortic valve is trileaflet, moderately thickened, moderately calcified.  Mild aortic regurgitation. Mitral valve mildly calcified annulus.  Trace mit ral regurgitation.  EKG:   EKG 08/18/2022: Sinus rhythm with first-degree AV block at rate of 84 bpm, left atrial enlargement, left axis deviation, left anterior fascicular block.   IVCD, nonspecific T abnormality.  PVCs (3).  Compared to 02/10/2022, PVC is new, otherwise no change.  Assessment     ICD-10-CM   1. Abnormal EKG  R94.31 EKG 12-Lead    2. Mild aortic regurgitation  I35.1        Medications Discontinued During This Encounter  Medication Reason   bismuth subsalicylate (PEPTO BISMOL) 262 MG/15ML suspension    triamcinolone cream (KENALOG) 0.1 %     No orders of the defined types were placed in this encounter.  Orders Placed This Encounter  Procedures   EKG 12-Lead   Recommendations:   Jeremy Marks is a 88 y.o. 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  He presents for a 88-monthoffice visit and follow-up of abnormal EKG.  1. Abnormal EKG Reviewed EKG, he has bifascicular block, but no change from prior EKG over the past 1 year.  He remains asymptomatic without dizziness or syncope.  Except for PVCs, EKG essentially unchanged.  Continue observation.  2. Mild aortic regurgitation He is presently 88years of age, aortic regurgitation appears to be mild to at most moderate both by blood pressure evaluation with low diastolic blood pressure and by auscultation however I see remains asymptomatic, no clinical evidence of heart failure, I did not order an echocardiogram.  Will continue to monitor this clinically only.  It was a pleasure to meet Jeremy Marks  I will see him back in a year.    JAdrian Prows MD, [facility]11/27/2023, 2:01 PM Office: [phone]

## 2022-08-20 DIAGNOSIS — R2681 Unsteadiness on feet: Secondary | ICD-10-CM | POA: Diagnosis not present

## 2022-08-27 DIAGNOSIS — R2681 Unsteadiness on feet: Secondary | ICD-10-CM | POA: Diagnosis not present

## 2022-09-04 DIAGNOSIS — R2681 Unsteadiness on feet: Secondary | ICD-10-CM | POA: Diagnosis not present

## 2022-09-10 DIAGNOSIS — R2681 Unsteadiness on feet: Secondary | ICD-10-CM | POA: Diagnosis not present

## 2022-10-29 DIAGNOSIS — B353 Tinea pedis: Secondary | ICD-10-CM | POA: Diagnosis not present

## 2023-01-05 DIAGNOSIS — H401111 Primary open-angle glaucoma, right eye, mild stage: Secondary | ICD-10-CM | POA: Diagnosis not present

## 2023-01-05 DIAGNOSIS — H401123 Primary open-angle glaucoma, left eye, severe stage: Secondary | ICD-10-CM | POA: Diagnosis not present

## 2023-06-25 DIAGNOSIS — Z0000 Encounter for general adult medical examination without abnormal findings: Secondary | ICD-10-CM | POA: Diagnosis not present

## 2023-06-25 DIAGNOSIS — I700 Atherosclerosis of aorta: Secondary | ICD-10-CM | POA: Diagnosis not present

## 2023-06-25 DIAGNOSIS — Z23 Encounter for immunization: Secondary | ICD-10-CM | POA: Diagnosis not present

## 2023-06-25 DIAGNOSIS — Z136 Encounter for screening for cardiovascular disorders: Secondary | ICD-10-CM | POA: Diagnosis not present

## 2023-08-27 ENCOUNTER — Ambulatory Visit: Payer: Medicare Other | Admitting: Cardiology

## 2023-08-27 DIAGNOSIS — L0390 Cellulitis, unspecified: Secondary | ICD-10-CM | POA: Diagnosis not present

## 2023-08-27 DIAGNOSIS — R2681 Unsteadiness on feet: Secondary | ICD-10-CM | POA: Diagnosis not present

## 2023-09-02 ENCOUNTER — Other Ambulatory Visit (INDEPENDENT_AMBULATORY_CARE_PROVIDER_SITE_OTHER): Payer: Medicare Other

## 2023-09-02 ENCOUNTER — Ambulatory Visit: Payer: Medicare Other | Attending: Cardiology | Admitting: Cardiology

## 2023-09-02 ENCOUNTER — Encounter: Payer: Self-pay | Admitting: Cardiology

## 2023-09-02 VITALS — BP 139/67 | HR 62 | Resp 16 | Ht 69.0 in | Wt 177.2 lb

## 2023-09-02 DIAGNOSIS — R600 Localized edema: Secondary | ICD-10-CM | POA: Diagnosis not present

## 2023-09-02 DIAGNOSIS — R55 Syncope and collapse: Secondary | ICD-10-CM

## 2023-09-02 DIAGNOSIS — I351 Nonrheumatic aortic (valve) insufficiency: Secondary | ICD-10-CM

## 2023-09-02 DIAGNOSIS — I452 Bifascicular block: Secondary | ICD-10-CM

## 2023-09-02 NOTE — Progress Notes (Signed)
Cardiology Office Note:  .   Date:  09/02/2023  ID:  Jeremy Marks, DOB 1923-02-14, MRN [id number] PCP: Joycelyn Rua, MD   HeartCare Providers Cardiologist:  Yates Decamp, MD   History of Present Illness: .   Jeremy Marks is a 89 y.o. 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  This annual visit, patient was found on the floor Wednesday before Thanksgiving with no recollection, developed significant leg swelling a week later and started on antibiotic for possible cellulitis.  He now presents for routine visit   Discussed the use of AI scribe software for clinical note transcription with the patient, who gave verbal consent to proceed.  History of Present Illness   The patient, with a history of heart disease, presents with right leg cellulitis and a recent episode of syncope. The cellulitis developed after a fall, but it is unclear how the infection was contracted. The patient has been taking Bactrim since last Wednesday and the infection seems to be responding. The leg is still swollen and a little red, but it is not hot or inflamed, and the patient does not have a fever.  The patient also had an episode of syncope and collapse the Wednesday before Thanksgiving. The patient does not remember how he fell or how long he was on the floor before being found by his daughter. The patient was dehydrated and had not eaten or drunk anything for two days. The patient was helped up, given food and liquids, and has been monitored since. A week after the fall, the patient's leg was swollen, red, and hot, and he was diagnosed with cellulitis.       Review of Systems  Cardiovascular:  Positive for leg swelling (right leg) and syncope. Negative for chest pain and dyspnea on exertion.   Labs   External  Labs:  Labs 06/25/2023:  Serum glucose 78 mg, BUN 13, creatinine 0.91, EGFR 76 mL, potassium 4.4, LFTs normal.  Total cholesterol 158, triglycerides 75, HDL 44, LDL 99.  Physical Exam:   VS:  BP 139/67 (BP Location: Left Arm, Patient Position: Sitting, Cuff Size: Normal)   Pulse 62   Resp 16   Ht 5\' 9"  (1.753 m)   Wt 177 lb 3.2 oz (80.4 kg)   SpO2 98%   BMI 26.17 kg/m    Wt Readings from Last 3 Encounters:  09/02/23 177 lb 3.2 oz (80.4 kg)  08/18/22 169 lb (76.7 kg)  02/10/22 168 lb 12.8 oz (76.6 kg)    Physical Exam Neck:     Vascular: No JVD.  Cardiovascular:     Rate and Rhythm: Normal rate and regular rhythm.     Pulses: Intact distal pulses.     Heart sounds: S1 normal and S2 normal. Murmur heard.     Early systolic murmur is present with a grade of 2/6 at the upper right sternal border.     No gallop.  Pulmonary:     Effort: Pulmonary effort is normal.     Breath sounds: Normal breath sounds.  Abdominal:     General: Bowel sounds are normal.     Palpations: Abdomen is soft.  Musculoskeletal:     Right lower leg: Edema (2-3+ pitting edema below the knee, warm to touch, nontender, skin appears inflamed.) present.     Left lower leg: No  edema.    Studies Reviewed: Marland Kitchen    ECHOCARDIOGRAM COMPLETE 02/06/2018  Normal LV size, severe basal septal hypertrophy, normal LV systolic function, EF 55 to 60%.  No regional wall motion abnormality.  Grade 1 diastolic dysfunction. Aortic valve is trileaflet, moderately thickened, moderately calcified.  Mild aortic regurgitation. Mitral valve mildly calcified annulus.  Trace mit ral regurgitation. EKG:    EKG Interpretation Date/Time:  Wednesday September 02 2023 15:01:02 EST Ventricular Rate:  62 PR Interval:  290 QRS Duration:  136 QT Interval:  452 QTC Calculation: 458 R Axis:   -57  Text Interpretation: EKG 09/02/2023: Sinus rhythm with first-degree AV block at rate of 62 bpm, left anterior fascicular block.  Cannot  exclude inferior infarct old.  Poor R progression, cannot exclude anterolateral lateral infarct old.  Nonspecific IVCD, borderline criteria for LVH.  Single PVC.  No significant change from 08/18/2022. Confirmed by Delrae Rend [phone]) on 09/02/2023 3:10:51 PM    EKG 08/18/2022: Sinus rhythm with first-degree AV block at rate of 84 bpm, left atrial enlargement, left axis deviation, left anterior fascicular block. IVCD, nonspecific T abnormality. PVCs (3).   Medications and allergies    Allergies  Allergen Reactions   Penicillins Anaphylaxis     Current Outpatient Medications:    bisacodyl (DULCOLAX) 5 MG EC tablet, Take 5 mg by mouth daily as needed for moderate constipation., Disp: , Rfl:    brimonidine (ALPHAGAN) 0.2 % ophthalmic solution, 1 drop 3 (three) times daily., Disp: , Rfl:    dorzolamide-timolol (COSOPT) 22.3-6.8 MG/ML ophthalmic solution, Place 1 drop into both eyes 2 (two) times daily., Disp: , Rfl:    Lactobacillus (PROBIOTIC ACIDOPHILUS PO), Take by mouth daily., Disp: , Rfl:    lactose free nutrition (BOOST) LIQD, Take 237 mLs by mouth., Disp: , Rfl:    latanoprost (XALATAN) 0.005 % ophthalmic solution, Place 1 drop into both eyes at bedtime., Disp: , Rfl:    sulfamethoxazole-trimethoprim (BACTRIM DS) 800-160 MG tablet, Take by mouth 2 (two) times daily., Disp: , Rfl:    ASSESSMENT AND PLAN: .      ICD-10-CM   1. Syncope and collapse  R55 LONG TERM MONITOR-LIVE TELEMETRY (3-14 DAYS)    CBC    TSH    2. Bifascicular block  I45.2     3. Mild aortic regurgitation  I35.1 EKG 12-Lead    4. Leg edema, right  R60.0 VAS [place] LOWER EXTREMITY VENOUS (DVT)     Assessment and Plan    Cellulitis Right leg cellulitis, currently on Bactrim since last Wednesday, with noted improvement in redness and heat. No fever present. -Complete Bactrim course.  Right Leg Edema New onset since a fall two weeks ago. Concern for possible deep vein thrombosis (DVT) given the swelling and  history of recent immobility. -Order right lower extremity venous duplex to rule out DVT. -Order CBC today.  Syncope and Collapse Episode of syncope and collapse two weeks ago with no recollection of the event. Abnormal EKG with flat anterior fascicular block and posterior block. -Order Zio patch for 2-week monitoring to exclude high degree AV block. -Follow up in 3 months unless abnormal findings.  -He has underlying bifascicular block as evidenced by first-degree AV block and left anterior fascicular block as well and he is presently 89 years of age.  Mild aortic regurgitation No change in physical exam.  No indication for echocardiogram unless event monitor reveals significant heart block or other abnormality.  Signed,  Yates Decamp, MD, [facility] 09/02/2023,  6:06 PM [facility] [address] [place], [place] [address] Phone: [phone]. Fax:  [phone]

## 2023-09-02 NOTE — Progress Notes (Unsigned)
Applied a 14 day Zio AT monitor to be mailed to patients home

## 2023-09-02 NOTE — Patient Instructions (Signed)
Medication Instructions:  The current medical regimen is effective;  continue present plan and medications.  *If you need a refill on your cardiac medications before your next appointment, please call your pharmacy*   Lab Work: Please have blood work today (CBC, TSH) If you have labs (blood work) drawn today and your tests are completely normal, you will receive your results only by: MyChart Message (if you have MyChart) OR A paper copy in the mail If you have any lab test that is abnormal or we need to change your treatment, we will call you to review the results.  Testing/Procedures: Your physician has requested that you have a right lower extremity venous duplex. This test is an ultrasound of the veins in the legs or arms. It looks at venous blood flow that carries blood from the heart to the legs or arms. Allow one hour for a Lower Venous exam. There are no restrictions or special instructions.  This testing is completed at our [facility] office.  Please note: We ask at that you not bring children with you during ultrasound (echo/ vascular) testing. Due to room size and safety concerns, children are not allowed in the ultrasound rooms during exams. Our front office staff cannot provide observation of children in our lobby area while testing is being conducted. An adult accompanying a patient to their appointment will only be allowed in the ultrasound room at the discretion of the ultrasound technician under special circumstances. We apologize for any inconvenience.  ZIO AT Long term monitor-Live Telemetry  Your physician has requested you wear a ZIO patch monitor for 14 days.  This is a single patch monitor. [employer] supplies one patch monitor per enrollment. Additional  stickers are not available.  Please do not apply patch if you will be having a Nuclear Stress Test, Echocardiogram, Cardiac CT, MRI,  or Chest Xray during the period you would be wearing the monitor. The patch cannot be  worn during  these tests. You cannot remove and re-apply the ZIO AT patch monitor.  Your ZIO patch monitor will be mailed 3 day USPS to your address on file. It may take 3-5 days to  receive your monitor after you have been enrolled.  Once you have received your monitor, please review the enclosed instructions. Your monitor has  already been registered assigning a specific monitor serial # to you.   Billing and Patient Assistance Program information  Meredeth Ide has been supplied with any insurance information on record for billing. [employer] offers a sliding scale Patient Assistance Program for patients without insurance, or whose  insurance does not completely cover the cost of the ZIO patch monitor. You must apply for the  Patient Assistance Program to qualify for the discounted rate. To apply, call [employer] at [phone],  select option 4, select option 2 , ask to apply for the Patient Assistance Program, (you can request an  interpreter if needed). [employer] will ask your household income and how many people are in your  household. [employer] will quote your out-of-pocket cost based on this information. They will also be able  to set up a 12 month interest free payment plan if needed.  Applying the monitor   Shave hair from upper left chest.  Hold the abrader disc by orange tab. Rub the abrader in 40 strokes over left upper chest as indicated in  your monitor instructions.  Clean area with 4 enclosed alcohol pads. Use all pads to ensure the area is cleaned thoroughly. Let  dry.  Apply patch as indicated in monitor instructions. Patch will be placed under collarbone on left side of  chest with arrow pointing upward.  Rub patch adhesive wings for 2 minutes. Remove the white label marked "1". Remove the white label  marked "2". Rub patch adhesive wings for 2 additional minutes.  While looking in a mirror, press and release button in center of patch. A small green light will flash 3-4  times.  This will be your only indicator that the monitor has been turned on.  Do not shower for the first 24 hours. You may shower after the first 24 hours.  Press the button if you feel a symptom. You will hear a small click. Record Date, Time and Symptom in  the Patient Log.   Starting the Gateway  In your kit there is a Audiological scientist box the size of a cellphone. This is Buyer, retail. It transmits all your  recorded data to [facility]. This box must always stay within 10 feet of you. Open the box and push the *  button. There will be a light that blinks orange and then green a few times. When the light stops  blinking, the Gateway is connected to the ZIO patch. Call [employer] at [phone] to confirm your monitor is transmitting.  Returning your monitor  Remove your patch and place it inside the Gateway. In the lower half of the Gateway there is a white  bag with prepaid postage on it. Place Gateway in bag and seal. Mail package back to [place] as soon as  possible. Your physician should have your final report approximately 7 days after you have mailed back  your monitor. Call [facility] Customer Care at [phone] if you have questions regarding your ZIO AT  patch monitor. Call them immediately if you see an orange light blinking on your monitor.  If your monitor falls off in less than 4 days, contact our Monitor department at [phone]. If your  monitor becomes loose or falls off after 4 days call [employer] at [phone] for suggestions on  securing your monitor   Follow-Up: At [facility], you and your health needs are our priority.  As part of our continuing mission to provide you with exceptional heart care, we have created designated Provider Care Teams.  These Care Teams include your primary Cardiologist (physician) and Advanced Practice Providers (APPs -  Physician Assistants and Nurse Practitioners) who all work together to provide you with the care you need,  when you need it.  We recommend signing up for the patient portal called "MyChart".  Sign up information is provided on this After Visit Summary.  MyChart is used to connect with patients for Virtual Visits (Telemedicine).  Patients are able to view lab/test results, encounter notes, upcoming appointments, etc.  Non-urgent messages can be sent to your provider as well.   To learn more about what you can do with MyChart, go to [email or web address].    Your next appointment:   3 month(s)  Provider:   Yates Decamp, MD

## 2023-09-03 DIAGNOSIS — R55 Syncope and collapse: Secondary | ICD-10-CM | POA: Diagnosis not present

## 2023-09-03 LAB — TSH: TSH: 2.24 u[IU]/mL (ref 0.450–4.500)

## 2023-09-03 LAB — CBC
Hematocrit: 34.6 % — ABNORMAL LOW (ref 37.5–51.0)
Hemoglobin: 11.4 g/dL — ABNORMAL LOW (ref 13.0–17.7)
MCH: 32.1 pg (ref 26.6–33.0)
MCHC: 32.9 g/dL (ref 31.5–35.7)
MCV: 98 fL — ABNORMAL HIGH (ref 79–97)
Platelets: 207 10*3/uL (ref 150–450)
RBC: 3.55 x10E6/uL — ABNORMAL LOW (ref 4.14–5.80)
RDW: 11.8 % (ref 11.6–15.4)
WBC: 9.2 10*3/uL (ref 3.4–10.8)

## 2023-09-04 NOTE — Progress Notes (Signed)
Labs stable will wait on venous duplex

## 2023-09-08 ENCOUNTER — Ambulatory Visit (HOSPITAL_COMMUNITY)
Admission: RE | Admit: 2023-09-08 | Discharge: 2023-09-08 | Disposition: A | Discharge status: Home / Self Care | Payer: Medicare Other | Source: Ambulatory Visit | Attending: Cardiology | Admitting: Cardiology

## 2023-09-08 DIAGNOSIS — R600 Localized edema: Secondary | ICD-10-CM | POA: Diagnosis not present

## 2023-09-08 NOTE — Progress Notes (Signed)
No DVT. Continue present management

## 2023-11-04 DIAGNOSIS — R600 Localized edema: Secondary | ICD-10-CM | POA: Diagnosis not present

## 2023-11-04 DIAGNOSIS — L300 Nummular dermatitis: Secondary | ICD-10-CM | POA: Diagnosis not present

## 2023-11-17 DIAGNOSIS — Z0000 Encounter for general adult medical examination without abnormal findings: Secondary | ICD-10-CM | POA: Diagnosis not present

## 2023-12-11 ENCOUNTER — Ambulatory Visit: Payer: Medicare Other | Admitting: Cardiology

## 2023-12-18 ENCOUNTER — Ambulatory Visit: Payer: Medicare Other | Attending: Cardiology | Admitting: Cardiology

## 2023-12-18 ENCOUNTER — Encounter: Payer: Self-pay | Admitting: Cardiology

## 2023-12-18 VITALS — BP 142/80 | HR 77 | Resp 16 | Ht 69.0 in | Wt 161.0 lb

## 2023-12-18 DIAGNOSIS — R55 Syncope and collapse: Secondary | ICD-10-CM | POA: Diagnosis not present

## 2023-12-18 DIAGNOSIS — M7989 Other specified soft tissue disorders: Secondary | ICD-10-CM

## 2023-12-18 NOTE — Patient Instructions (Signed)
 Medication Instructions:  Your physician recommends that you continue on your current medications as directed. Please refer to the Current Medication list given to you today.  *If you need a refill on your cardiac medications before your next appointment, please call your pharmacy*  Lab Work: none If you have labs (blood work) drawn today and your tests are completely normal, you will receive your results only by: MyChart Message (if you have MyChart) OR A paper copy in the mail If you have any lab test that is abnormal or we need to change your treatment, we will call you to review the results.  Testing/Procedures: none  Follow-Up: At [facility], you and your health needs are our priority.  As part of our continuing mission to provide you with exceptional heart care, our providers are all part of one team.  This team includes your primary Cardiologist (physician) and Advanced Practice Providers or APPs (Physician Assistants and Nurse Practitioners) who all work together to provide you with the care you need, when you need it.  Your next appointment:   As needed  Provider:   Yates Decamp, MD     We recommend signing up for the patient portal called "MyChart".  Sign up information is provided on this After Visit Summary.  MyChart is used to connect with patients for Virtual Visits (Telemedicine).  Patients are able to view lab/test results, encounter notes, upcoming appointments, etc.  Non-urgent messages can be sent to your provider as well.   To learn more about what you can do with MyChart, go to [email or web address].   Other Instructions       1st Floor: - Lobby - Registration  - Pharmacy  - Lab - Cafe  2nd Floor: - PV Lab - Diagnostic Testing (echo, CT, nuclear med)  3rd Floor: - Vacant  4th Floor: - TCTS (cardiothoracic surgery) - AFib Clinic - Structural Heart Clinic - Vascular Surgery  - Vascular Ultrasound  5th Floor: - HeartCare Cardiology  (general and EP) - Clinical Pharmacy for coumadin, hypertension, lipid, weight-loss medications, and med management appointments    Valet parking services will be available as well.

## 2023-12-18 NOTE — Progress Notes (Signed)
 Cardiology Office Note:  .   Date:  12/18/2023  ID:  Jeremy Marks, DOB 1923/08/13, MRN [id number] PCP: Joycelyn Rua, MD  [place] [facility] Providers Cardiologist:  Yates Decamp, MD   History of Present Illness: .   Jeremy Marks is a 89 y.o. 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  He was evaluated for syncope.  He wore an event monitor and presents for follow-up.  He does have chronic bilateral lower extremity edema, cellulitis has resolved with antibiotic therapy on his right leg and reports edema is back to his baseline.  He has been compliant with using support stockings.  Patient is accompanied by daughter.  No specific complaints today.  Discussed the use of AI scribe software for clinical note transcription with the patient, who gave verbal consent to proceed.  History of Present Illness The patient, a 89 year old male with a history of passing out and falling, presents a week after a fall due to loss of balance. The patient reports that he was trying to reach something when he lost his balance, hit a dresser, and fell on his rear. He denies any loss of consciousness during this incident. The patient has a history of similar falls, with the most severe one occurring before Thanksgiving when he passed out and was found on the floor after an unknown duration. The patient also reports chronic leg swelling, which has been a long-standing issue. He has been using Xeravay cream for skin care, which he reports has been helpful. The patient has a necklace for activating EMS in case of emergencies, but he doesn't wear it regularly due to discomfort with the length of the  necklace.  Labs   Lab Results  Component Value Date   CHOL 148 02/06/2018   HDL 45 02/06/2018   LDLCALC 90 02/06/2018   TRIG 67 02/06/2018   CHOLHDL 3.3 02/06/2018   Lab Results  Component Value Date   NA 132 (L) 02/08/2018   K 3.8 02/08/2018   CO2 20 (L) 02/08/2018   GLUCOSE 121 (H) 02/08/2018   BUN 9 02/08/2018   CREATININE 0.80 02/08/2018   CALCIUM 7.8 (L) 02/08/2018   GFRNONAA >60 02/08/2018      Latest Ref Rng & Units 02/08/2018    4:28 AM 02/07/2018    5:51 AM 02/06/2018    4:20 AM  BMP  Glucose 65 - 99 mg/dL 706  237  628   BUN 6 - 20 mg/dL 9  11  14    Creatinine 0.61 - 1.24 mg/dL 3.15  1.76  1.60   Sodium 135 - 145 mmol/L 132  133  134   Potassium 3.5 - 5.1 mmol/L 3.8  3.4  3.7   Chloride 101 - 111 mmol/L 104  102  103   CO2 22 - 32 mmol/L 20  22  21    Calcium 8.9 - 10.3 mg/dL 7.8  8.1  8.2       Latest Ref Rng & Units 09/02/2023    4:22 PM 02/08/2018    4:28 AM 02/07/2018    5:51 AM  CBC  WBC 3.4 - 10.8 x10E3/uL 9.2  14.9  14.5   Hemoglobin 13.0 - 17.7 g/dL 73.7  10.6  12.7  Hematocrit 37.5 - 51.0 % 34.6  35.5  37.2   Platelets 150 - 450 x10E3/uL 207  123  114    No results found for: "HGBA1C"  Lab Results  Component Value Date   TSH 2.240 09/02/2023   Review of Systems  Cardiovascular:  Positive for leg swelling (chronic and improved). Negative for chest pain and dyspnea on exertion.  Neurological:  Positive for disturbances in coordination.   Physical Exam:   VS:  BP (!) 142/80 (BP Location: Left Arm, Patient Position: Sitting, Cuff Size: Normal)   Pulse 77   Resp 16   Ht 5\' 9"  (1.753 m)   Wt 161 lb (73 kg)   SpO2 97%   BMI 23.78 kg/m    Wt Readings from Last 3 Encounters:  12/18/23 161 lb (73 kg)  09/02/23 177 lb 3.2 oz (80.4 kg)  08/18/22 169 lb (76.7 kg)    Physical Exam Neck:     Vascular: No JVD.  Cardiovascular:     Rate and Rhythm: Normal rate and regular rhythm.     Pulses: Intact distal pulses.     Heart sounds: S1 normal  and S2 normal. Murmur heard.     Early systolic murmur is present with a grade of 2/6 at the upper right sternal border.     No gallop.  Pulmonary:     Effort: Pulmonary effort is normal.     Breath sounds: Normal breath sounds.  Abdominal:     General: Bowel sounds are normal.     Palpations: Abdomen is soft.  Musculoskeletal:     Right lower leg: Edema (2+ below knee pitting (chronic)) present.     Left lower leg: Edema (1-2+ below knee pitting (chronic)) present.    Studies Reviewed: Marland Kitchen    Zio patch EKG monitoring 14 days starting 09/02/2023: Predominant underlying rhythm was sinus rhythm with first-degree AV block and bundle branch block.  Minimum heart rate 47 bpm at 5:30 PM and maximum heart rate of 116 bpm at 3:45 PM with average heartbeat of 68 bpm. There were occasional PVCs, PVC burden 2.5%.  The rare rare ventricular couplets and triplets.  There was 1 ventricular bigeminy episode for 35 seconds and trigeminy episode for 1 minute and 30 seconds. There were no pauses, no atrial fibrillation.  There is no patient triggered/symptomatic events.  Lower Extremity Venous Duplex (Right) 09/08/2023:  - No evidence of deep vein thrombosis in the lower extremity. No indirect  evidence of obstruction proximal to the inguinal ligament.   - No cystic structure found in the popliteal fossa.  - Superficial edema noted in the calf.  - Ultrasound characteristics of enlarged lymph nodes are noted in the groin.   EKG:   EKG 09/02/2023: Sinus rhythm with first-degree AV block at rate of 62 bpm, left anterior fascicular block. Cannot exclude inferior infarct old. Poor R progression, cannot exclude anterolateral lateral infarct old. Nonspecific IVCD, borderline criteria for LVH. Single PVC. No significant change from 08/18/2022.   Medications and allergies    Allergies  Allergen Reactions   Penicillins Anaphylaxis     Current Outpatient Medications:    bisacodyl (DULCOLAX) 5 MG EC tablet,  Take 5 mg by mouth daily as needed for moderate constipation., Disp: , Rfl:    brimonidine (ALPHAGAN) 0.2 % ophthalmic solution, 1 drop 3 (three) times daily., Disp: , Rfl:    cholecalciferol (VITAMIN D3) 25 MCG (1000 UNIT) tablet, Take 1,000 Units by mouth daily., Disp: , Rfl:  dorzolamide-timolol (COSOPT) 22.3-6.8 MG/ML ophthalmic solution, Place 1 drop into both eyes 2 (two) times daily., Disp: , Rfl:    Lactobacillus (PROBIOTIC ACIDOPHILUS PO), Take by mouth daily., Disp: , Rfl:    lactose free nutrition (BOOST) LIQD, Take 237 mLs by mouth., Disp: , Rfl:    latanoprost (XALATAN) 0.005 % ophthalmic solution, Place 1 drop into both eyes at bedtime., Disp: , Rfl:    ASSESSMENT AND PLAN: .      ICD-10-CM   1. Syncope and collapse  R55     2. Leg swelling chronic  M79.89       No orders of the defined types were placed in this encounter.   There are no discontinued medications.  1. Syncope and collapse Syncope that occurred after Thanksgiving 2024, event monitor reviewed, no heart block, no high degree AV block.  Will continue monitoring for now. Advise on safety measures to prevent future falls, including wearing the emergency alert necklace. For now watchful waiting, by annual EKG to evaluate for any heart block would be recommended and he can follow-up with the PCP for further management and evaluation. He should notify if any near-syncope or syncope occurs, especially when sitting or lying down.   2. Leg swelling chronic Stable, has been using support stockings.  Previously noted 3+ leg edema has now resolved, has chronic stable leg edema.  Encouraged him to continue to keep his feet elevated during resting and to wear support stockings.  3. Follow-up   Cardiology follow-up is not required unless new issues arise. He will be followed by the family doctor and [facility] doctor. Recommend an EKG every six months during visits with the family doctor to monitor for any  changes.   Signed,  Yates Decamp, MD, [facility] 12/18/2023, 12:18 PM [facility] [address] [place], [place] [address] Phone: [phone]. Fax:  [phone]

## 2024-01-13 DIAGNOSIS — H401111 Primary open-angle glaucoma, right eye, mild stage: Secondary | ICD-10-CM | POA: Diagnosis not present

## 2024-01-13 DIAGNOSIS — H401123 Primary open-angle glaucoma, left eye, severe stage: Secondary | ICD-10-CM | POA: Diagnosis not present

## 2024-03-21 DIAGNOSIS — H401123 Primary open-angle glaucoma, left eye, severe stage: Secondary | ICD-10-CM | POA: Diagnosis not present

## 2024-03-21 DIAGNOSIS — H401111 Primary open-angle glaucoma, right eye, mild stage: Secondary | ICD-10-CM | POA: Diagnosis not present

## 2024-05-30 DIAGNOSIS — H401123 Primary open-angle glaucoma, left eye, severe stage: Secondary | ICD-10-CM | POA: Diagnosis not present

## 2024-05-30 DIAGNOSIS — H401111 Primary open-angle glaucoma, right eye, mild stage: Secondary | ICD-10-CM | POA: Diagnosis not present
